# Patient Record
Sex: Female | Born: 1985
Health system: Southern US, Community
[De-identification: ages and names within clinical notes are randomized; demographics above are authoritative.]

## PROBLEM LIST (undated history)

## (undated) DIAGNOSIS — I10 Essential (primary) hypertension: Secondary | ICD-10-CM

---

## 2008-02-28 ENCOUNTER — Emergency Department (HOSPITAL_COMMUNITY): Admission: EM | Admit: 2008-02-28 | Discharge: 2008-02-28 | Payer: Self-pay | Admitting: Family Medicine

## 2008-10-11 ENCOUNTER — Emergency Department (HOSPITAL_COMMUNITY): Admission: EM | Admit: 2008-10-11 | Discharge: 2008-10-11 | Payer: Self-pay | Admitting: Emergency Medicine

## 2009-03-31 ENCOUNTER — Emergency Department (HOSPITAL_COMMUNITY): Admission: EM | Admit: 2009-03-31 | Discharge: 2009-03-31 | Payer: Self-pay | Admitting: Emergency Medicine

## 2009-08-11 ENCOUNTER — Emergency Department (HOSPITAL_COMMUNITY): Admission: EM | Admit: 2009-08-11 | Discharge: 2009-08-11 | Payer: Self-pay | Admitting: Emergency Medicine

## 2010-04-17 ENCOUNTER — Emergency Department (HOSPITAL_COMMUNITY): Admission: EM | Admit: 2010-04-17 | Discharge: 2010-04-17 | Payer: Self-pay | Admitting: Emergency Medicine

## 2010-04-19 ENCOUNTER — Emergency Department (HOSPITAL_COMMUNITY): Admission: EM | Admit: 2010-04-19 | Discharge: 2010-04-19 | Payer: Self-pay | Admitting: Emergency Medicine

## 2012-01-27 ENCOUNTER — Emergency Department (HOSPITAL_COMMUNITY)
Admission: EM | Admit: 2012-01-27 | Discharge: 2012-01-27 | Disposition: A | Discharge status: Home / Self Care | Payer: Self-pay | Attending: Emergency Medicine | Admitting: Emergency Medicine

## 2012-01-27 ENCOUNTER — Encounter (HOSPITAL_COMMUNITY): Payer: Self-pay | Admitting: Emergency Medicine

## 2012-01-27 DIAGNOSIS — N390 Urinary tract infection, site not specified: Secondary | ICD-10-CM | POA: Insufficient documentation

## 2012-01-27 DIAGNOSIS — R109 Unspecified abdominal pain: Secondary | ICD-10-CM | POA: Insufficient documentation

## 2012-01-27 DIAGNOSIS — R300 Dysuria: Secondary | ICD-10-CM | POA: Insufficient documentation

## 2012-01-27 LAB — URINALYSIS, ROUTINE W REFLEX MICROSCOPIC
Glucose, UA: NEGATIVE mg/dL
Specific Gravity, Urine: 1.01 (ref 1.005–1.030)
pH: 7 (ref 5.0–8.0)

## 2012-01-27 LAB — URINE MICROSCOPIC-ADD ON

## 2012-01-27 LAB — PREGNANCY, URINE: Preg Test, Ur: NEGATIVE

## 2012-01-27 MED ORDER — NITROFURANTOIN MONOHYD MACRO 100 MG PO CAPS: 100.0000 mg | ORAL_CAPSULE | Freq: Two times a day (BID) | ORAL | Status: DC

## 2012-01-27 NOTE — Discharge Instructions (Signed)

## 2012-01-27 NOTE — ED Notes (Signed)
Patient with c/o left flank pain since Thursday. Reports dysuria.

## 2012-01-27 NOTE — ED Provider Notes (Signed)
History   This chart was scribed for Nelia Shi, MD by Charolett Bumpers . The patient was seen in room APA05/APA05 and the patient's care was started at 10:46am.   CSN: [id number]  Arrival date & time 01/27/12  1025   First MD Initiated Contact with Patient 01/27/12 1045      Chief Complaint  Patient presents with  . Flank Pain    (Consider location/radiation/quality/duration/timing/severity/associated sxs/prior treatment) HPI Kimberly Kennedy is a 26 y.o. female who presents to the Emergency Department complaining of constant, moderate left-sided flank pain that started 2 days ago. Patient reports associated dysuria. Patient denies fever. Patient denies h/o kidney infections or h/o kidney stones. No pertinent medical hx reported. No other symptoms reported.     History reviewed. No pertinent past medical history.  History reviewed. No pertinent past surgical history.  No family history on file.  History  Substance Use Topics  . Smoking status: Never Smoker   . Smokeless tobacco: Not on file  . Alcohol Use: No    OB History    Grav Para Term Preterm Abortions TAB SAB Ect Mult Living                  Review of Systems A complete 10 system review of systems was obtained and is otherwise negative except as noted in the HPI and PMH.   Allergies  Review of patient's allergies indicates no known allergies.  Home Medications   Current Outpatient Rx  Name Route Sig Dispense Refill  . NITROFURANTOIN MONOHYD MACRO 100 MG PO CAPS Oral Take 1 capsule (100 mg total) by mouth 2 (two) times daily. 14 capsule 0    BP 152/94  Pulse 66  Temp(Src) 98.2 F (36.8 C) (Oral)  Resp 18  Ht 5\' 7"  (1.702 m)  Wt 321 lb 11.2 oz (145.922 kg)  BMI 50.39 kg/m2  SpO2 99%  LMP 01/15/2012  Physical Exam  Nursing note and vitals reviewed. Constitutional: She is oriented to person, place, and time. She appears well-developed and well-nourished. No distress.  HENT:  Head:  Normocephalic and atraumatic.  Eyes: EOM are normal. Pupils are equal, round, and reactive to light.  Neck: Normal range of motion. Neck supple. No tracheal deviation present.  Cardiovascular: Normal rate, regular rhythm and normal heart sounds.   Pulmonary/Chest: Effort normal and breath sounds normal. No respiratory distress.  Abdominal: Soft. Bowel sounds are normal. She exhibits no distension.  Musculoskeletal: Normal range of motion. She exhibits no edema.  Neurological: She is alert and oriented to person, place, and time. No sensory deficit.  Skin: Skin is warm and dry.  Psychiatric: She has a normal mood and affect. Her behavior is normal.    ED Course  Procedures (including critical care time)  DIAGNOSTIC STUDIES: Oxygen Saturation is 99% on room air, normal by my interpretation.    COORDINATION OF CARE:     Labs Reviewed  URINALYSIS, ROUTINE W REFLEX MICROSCOPIC - Abnormal; Notable for the following:    Leukocytes, UA SMALL (*)    All other components within normal limits  URINE MICROSCOPIC-ADD ON - Abnormal; Notable for the following:    Squamous Epithelial / LPF FEW (*)    Bacteria, UA MANY (*)    All other components within normal limits  PREGNANCY, URINE   No results found.   1. UTI (lower urinary tract infection)       MDM    I personally performed the services described in this  documentation, which was scribed in my presence. The recorded information has been reviewed and considered.      Nelia Shi, MD 01/27/12 [phone]

## 2012-01-27 NOTE — ED Notes (Signed)
Pt states is having urinary frequency with small amounts of urine, Pt also reports dysuria and thick green vaginal discharge. This has been going on since Thursday.  Pt also reports left flank discomfort and pressure.  Urine sample obtained and sent to lab pending results.

## 2012-01-30 ENCOUNTER — Encounter (HOSPITAL_COMMUNITY): Payer: Self-pay | Admitting: *Deleted

## 2012-01-30 ENCOUNTER — Emergency Department (HOSPITAL_COMMUNITY)
Admission: EM | Admit: 2012-01-30 | Discharge: 2012-01-31 | Disposition: A | Discharge status: Home / Self Care | Payer: Self-pay | Attending: Emergency Medicine | Admitting: Emergency Medicine

## 2012-01-30 DIAGNOSIS — I10 Essential (primary) hypertension: Secondary | ICD-10-CM | POA: Insufficient documentation

## 2012-01-30 DIAGNOSIS — R21 Rash and other nonspecific skin eruption: Secondary | ICD-10-CM | POA: Insufficient documentation

## 2012-01-30 DIAGNOSIS — T7840XA Allergy, unspecified, initial encounter: Secondary | ICD-10-CM

## 2012-01-30 DIAGNOSIS — Z888 Allergy status to other drugs, medicaments and biological substances status: Secondary | ICD-10-CM | POA: Insufficient documentation

## 2012-01-30 LAB — URINALYSIS, ROUTINE W REFLEX MICROSCOPIC
Bilirubin Urine: NEGATIVE
Ketones, ur: NEGATIVE mg/dL
Nitrite: NEGATIVE
Urobilinogen, UA: 0.2 mg/dL (ref 0.0–1.0)
pH: 6 (ref 5.0–8.0)

## 2012-01-30 NOTE — ED Notes (Signed)
Pt reports starting Bactim on Saturday.  States that she began itching yesterday with worsening today.  C/O rash and "burning sensation" on neck and upper chest.  Denies feeling SOB.  No distress noted.

## 2012-01-30 NOTE — ED Notes (Signed)
Pt reports she started taking septra sat and the next day she began to notice a rash on chest and neck, pt reports the rash is worse tonight, nad

## 2012-01-30 NOTE — ED Provider Notes (Signed)
History    This chart was scribed for Vida Roller, MD, MD by Smitty Pluck. The patient was seen in room APA12 and the patient's care was started at 11:18PM.   CSN: [id number]  Arrival date & time 01/30/12  2235   First MD Initiated Contact with Patient 01/30/12 2259      Chief Complaint  Patient presents with  . Rash    (Consider location/radiation/quality/duration/timing/severity/associated sxs/prior treatment) The history is provided by the patient.   Kimberly Kennedy is a 26 y.o. female who presents to the Emergency Department complaining of moderate rash on right and back of neck radiating to upper chest graduat onset 2 days ago and is gradually getting worse. She reports that there is a burning sensation in the same area. Pt took Benadryl with minor relief. Pt was in ED for UTI  3 days ago and was given Bactim. She started itching on Sunday after taking Bactim 1 day before. Pt reports that it was gradual onset with itching. Pt denies sores in mouth. She reports that her stomach is itching. The symptoms have been constant and improved temporarily with benadryl  History reviewed. No pertinent past medical history.  History reviewed. No pertinent past surgical history.  No family history on file.  History  Substance Use Topics  . Smoking status: Never Smoker   . Smokeless tobacco: Not on file  . Alcohol Use: No    OB History    Grav Para Term Preterm Abortions TAB SAB Ect Mult Living                  Review of Systems  All other systems reviewed and are negative.   10 Systems reviewed and are negative for acute change except as noted in the HPI.  Allergies  Review of patient's allergies indicates no known allergies.  Home Medications   Current Outpatient Rx  Name Route Sig Dispense Refill  . POLYETHYLENE GLYCOL 400 0.25 % OP SOLN Ophthalmic Apply 1 drop to eye daily as needed. For dry eye relief    . SULFAMETHOXAZOLE-TMP DS 800-160 MG PO TABS Oral Take 1  tablet by mouth 2 (two) times daily. For 7 days    . PREDNISONE 20 MG PO TABS Oral Take 1 tablet (20 mg total) by mouth daily. 10 tablet 0    BP 138/78  Pulse 82  Temp(Src) 97.7 F (36.5 C) (Oral)  Resp 20  SpO2 97%  LMP 01/15/2012  Physical Exam  Nursing note and vitals reviewed. Constitutional: She is oriented to person, place, and time. She appears well-developed and well-nourished. No distress.  HENT:  Head: Normocephalic and atraumatic.  Mouth/Throat: Oropharynx is clear and moist.       No mouth sores   Eyes: EOM are normal. Pupils are equal, round, and reactive to light.  Neck: Normal range of motion. Neck supple. No tracheal deviation present.  Cardiovascular: Normal rate.   Pulmonary/Chest: Effort normal and breath sounds normal. No respiratory distress.  Abdominal: Soft. Bowel sounds are normal. She exhibits no distension.  Musculoskeletal: Normal range of motion.  Neurological: She is alert and oriented to person, place, and time.  Skin: Skin is warm and dry.       No rashes on palms  Nail beds Fine papillar rash across chest and neck and forehead   Psychiatric: She has a normal mood and affect. Her behavior is normal.    ED Course  Procedures (including critical care time) DIAGNOSTIC STUDIES: Oxygen Saturation  is 99% on room air, normal by my interpretation.    COORDINATION OF CARE: 11:25PM EDP discusses pt ED treatment course with pt    Labs Reviewed  URINALYSIS, ROUTINE W REFLEX MICROSCOPIC   No results found.   1. Allergic reaction caused by a drug       MDM  Patient's presentation is consistent with an allergic reaction to the sulfamethoxazole trimethoprim. Patient will require prednisone, intermittent Benadryl as she has already attempted this and had some transient improvement. Will discontinue the Bactrim he immediately, retest urine to see if clean at this time. Has already had 5 days of antibiotics, may not need further treatment for  infection  Symptoms improved, prednisone given, vital signs normal, likely Bactrim drug reaction, urinalysis has been evaluated by myself and there is no signs of infection. Stop antibiotics,, prednisone  I personally performed the services described in this documentation, which was scribed in my presence. The recorded information has been reviewed and considered.           Vida Roller, MD 01/31/12 [phone]

## 2012-01-31 MED ORDER — PREDNISONE 20 MG PO TABS
40.0000 mg | ORAL_TABLET | Freq: Once | ORAL | Status: AC
  Administered 2012-01-31: 40 mg via ORAL
  Filled 2012-01-31: qty 2

## 2012-01-31 MED ORDER — PREDNISONE 20 MG PO TABS: 20.0000 mg | ORAL_TABLET | Freq: Every day | ORAL | Status: AC

## 2012-01-31 NOTE — Discharge Instructions (Signed)
The rash that you're having is likely a result of the Bactrim medication that you have been taking for your urinary infection. Your urinalysis is clean and shows no signs of infection. Please stop taking the antibiotics immediately and take the prednisone as directed. Benadryl every 6 hours as needed for itching. Return to the emergency department for severe or worsening swelling, rash, difficulty breathing

## 2012-11-20 ENCOUNTER — Emergency Department (HOSPITAL_COMMUNITY)
Admission: EM | Admit: 2012-11-20 | Discharge: 2012-11-20 | Disposition: A | Discharge status: Home / Self Care | Payer: Self-pay | Attending: Emergency Medicine | Admitting: Emergency Medicine

## 2012-11-20 ENCOUNTER — Emergency Department (HOSPITAL_COMMUNITY): Payer: Self-pay

## 2012-11-20 ENCOUNTER — Encounter (HOSPITAL_COMMUNITY): Payer: Self-pay | Admitting: Emergency Medicine

## 2012-11-20 DIAGNOSIS — R059 Cough, unspecified: Secondary | ICD-10-CM | POA: Insufficient documentation

## 2012-11-20 DIAGNOSIS — R509 Fever, unspecified: Secondary | ICD-10-CM | POA: Insufficient documentation

## 2012-11-20 DIAGNOSIS — R6889 Other general symptoms and signs: Secondary | ICD-10-CM

## 2012-11-20 DIAGNOSIS — IMO0001 Reserved for inherently not codable concepts without codable children: Secondary | ICD-10-CM | POA: Insufficient documentation

## 2012-11-20 DIAGNOSIS — R05 Cough: Secondary | ICD-10-CM | POA: Insufficient documentation

## 2012-11-20 DIAGNOSIS — Z79899 Other long term (current) drug therapy: Secondary | ICD-10-CM | POA: Insufficient documentation

## 2012-11-20 DIAGNOSIS — J029 Acute pharyngitis, unspecified: Secondary | ICD-10-CM | POA: Insufficient documentation

## 2012-11-20 MED ORDER — PROMETHAZINE-CODEINE 6.25-10 MG/5ML PO SYRP
10.0000 mL | ORAL_SOLUTION | Freq: Once | ORAL | Status: AC
  Administered 2012-11-20: 10 mL via ORAL

## 2012-11-20 MED ORDER — PROMETHAZINE-CODEINE 6.25-10 MG/5ML PO SYRP: 10.0000 mL | ORAL_SOLUTION | Freq: Four times a day (QID) | ORAL | Status: DC | PRN

## 2012-11-20 MED ORDER — PROMETHAZINE-CODEINE 6.25-10 MG/5ML PO SYRP
ORAL_SOLUTION | ORAL | Status: AC
  Administered 2012-11-20: 10 mL via ORAL
  Filled 2012-11-20: qty 10

## 2012-11-20 MED ORDER — IBUPROFEN 800 MG PO TABS
800.0000 mg | ORAL_TABLET | Freq: Once | ORAL | Status: AC
  Administered 2012-11-20: 800 mg via ORAL
  Filled 2012-11-20: qty 1

## 2012-11-20 NOTE — ED Notes (Signed)
Pt c/o sore throat, cough, bodyaches, and fever since yesterday.

## 2012-11-20 NOTE — ED Notes (Signed)
Patient with no complaints at this time. Respirations even and unlabored. Skin warm/dry. Discharge instructions reviewed with patient at this time. Patient given opportunity to voice concerns/ask questions. Patient discharged at this time and left Emergency Department with steady gait.   

## 2012-11-20 NOTE — ED Provider Notes (Signed)
History     CSN: [id number]  Arrival date & time 11/20/12  1754   First MD Initiated Contact with Patient 11/20/12 1852      Chief Complaint  Patient presents with  . Fever  . Generalized Body Aches    (Consider location/radiation/quality/duration/timing/severity/associated sxs/prior treatment) HPI Comments: Kimberly Kennedy presents with fever and chills,  Sore throat,  Body aches and nonproductive cough since waking up yesterday morning.  She has taken coricidin (chlorpheniramine -apap) with little relief of symptoms.  She has been tolerating PO fluids,  But has decreased appetite.  She describes  midsternal burning pain when she coughs, which has been nonproductive. She denies sob and denies chest pain (except when coughing).    The history is provided by the patient and a parent.    History reviewed. No pertinent past medical history.  History reviewed. No pertinent past surgical history.  No family history on file.  History  Substance Use Topics  . Smoking status: Never Smoker   . Smokeless tobacco: Not on file  . Alcohol Use: No    OB History    Grav Para Term Preterm Abortions TAB SAB Ect Mult Living                  Review of Systems  Constitutional: Positive for fever and chills.  HENT: Positive for sore throat. Negative for congestion, trouble swallowing, neck pain and neck stiffness.   Eyes: Negative.   Respiratory: Positive for cough. Negative for chest tightness, shortness of breath and wheezing.   Cardiovascular: Negative for chest pain.  Gastrointestinal: Negative for nausea, vomiting and abdominal pain.  Genitourinary: Negative.   Musculoskeletal: Negative for joint swelling and arthralgias.  Skin: Negative.  Negative for rash and wound.  Neurological: Negative for dizziness, weakness, light-headedness, numbness and headaches.  Hematological: Negative.   Psychiatric/Behavioral: Negative.     Allergies  Bactrim  Home Medications   Current  Outpatient Rx  Name  Route  Sig  Dispense  Refill  . CHLORPHENIRAMINE-ACETAMINOPHEN 2-325 MG PO TABS   Oral   Take 1-2 tablets by mouth 2 (two) times daily as needed. For cold and flu symptoms         . POLYETHYLENE GLYCOL 400 0.25 % OP SOLN   Ophthalmic   Apply 1 drop to eye daily as needed. For dry eye relief         . PROMETHAZINE-CODEINE 6.25-10 MG/5ML PO SYRP   Oral   Take 10 mLs by mouth every 6 (six) hours as needed for cough.   120 mL   0     BP 152/90  Pulse 109  Temp 102.8 F (39.3 C) (Oral)  Resp 20  Ht 5\' 7"  (1.702 m)  Wt 297 lb (134.718 kg)  BMI 46.52 kg/m2  SpO2 98%  LMP 11/20/2012  Physical Exam  Nursing note and vitals reviewed. Constitutional: She appears well-developed and well-nourished.  HENT:  Head: Normocephalic and atraumatic.  Nose: Nose normal.  Mouth/Throat: Oropharynx is clear and moist. No oropharyngeal exudate.  Eyes: Conjunctivae normal are normal.  Neck: Normal range of motion. Neck supple.  Cardiovascular: Normal rate, regular rhythm, normal heart sounds and intact distal pulses.   Pulmonary/Chest: Effort normal and breath sounds normal. No respiratory distress. She has no wheezes. She exhibits no tenderness.  Abdominal: Soft. Bowel sounds are normal. She exhibits no distension. There is no tenderness. There is no rebound.  Musculoskeletal: Normal range of motion.  Neurological: She is alert.  Skin: Skin is warm and dry. She is not diaphoretic. No erythema.  Psychiatric: She has a normal mood and affect.    ED Course  Procedures (including critical care time)   Labs Reviewed  RAPID STREP SCREEN   Dg Chest 2 View  11/20/2012  *RADIOLOGY REPORT*  Clinical Data: Fever, cough  CHEST - 2 VIEW  Comparison: None.  Findings: Lungs are essentially clear.  No focal consolidation.  No pleural effusion or pneumothorax.  The heart is top normal in size.  Visualized osseous structures are within normal limits.  IMPRESSION: No evidence of  acute cardiopulmonary disease.   Original Report Authenticated By: Charline Bills, M.D.      1. Flu-like symptoms    Motrin given in ed,  Phenergan/codeine was helpful with cough and sore throat. Temp recheck improved.  MDM  Prescribed phenergan/codeine.  Encouraged rest, fluids,  Tylenol/motrin.  Discussed tamiflu which pt deferred.   The patient appears reasonably screened and/or stabilized for discharge and I doubt any other medical condition or other [facility] requiring further screening, evaluation, or treatment in the ED at this time prior to discharge. Patients labs and/or radiological studies were reviewed during the medical decision making and disposition process.        Burgess Amor, PA 11/20/12 2017  Burgess Amor, PA 11/20/12 2017

## 2012-11-21 NOTE — ED Provider Notes (Signed)
Medical screening examination/treatment/procedure(s) were performed by non-physician practitioner and as supervising physician I was immediately available for consultation/collaboration.   Carleene Cooper III, MD 11/21/12 [phone]

## 2016-11-03 ENCOUNTER — Emergency Department (HOSPITAL_COMMUNITY)
Admission: EM | Admit: 2016-11-03 | Discharge: 2016-11-03 | Disposition: A | Discharge status: Home / Self Care | Payer: BLUE CROSS/BLUE SHIELD | Attending: Emergency Medicine | Admitting: Emergency Medicine

## 2016-11-03 ENCOUNTER — Encounter (HOSPITAL_COMMUNITY): Payer: Self-pay | Admitting: *Deleted

## 2016-11-03 DIAGNOSIS — J069 Acute upper respiratory infection, unspecified: Secondary | ICD-10-CM | POA: Diagnosis not present

## 2016-11-03 DIAGNOSIS — J029 Acute pharyngitis, unspecified: Secondary | ICD-10-CM | POA: Diagnosis not present

## 2016-11-03 DIAGNOSIS — R0981 Nasal congestion: Secondary | ICD-10-CM | POA: Diagnosis present

## 2016-11-03 NOTE — ED Provider Notes (Signed)
[facility] DEPT Provider Note   CSN: [id number] Arrival date & time: 11/03/16  1237  By signing my name below, I, Rayna Sexton, attest that this documentation has been prepared under the direction and in the presence of Daleen Bo, MD. Electronically Signed: Rayna Sexton, ED Scribe. 11/03/16. 12:56 PM.   History   Chief Complaint Chief Complaint  Patient presents with  . Nasal Congestion  . Cough    HPI HPI Comments: Kimberly Kennedy is a 30 y.o. female who presents to the Emergency Department complaining of moderate chest and sinus congestion x 3 days. Pt reports associated HA, gradually alleviating sore throat, mild SOB with exertion, and a productive cough. Pt has taken mucinex and and allegra w/o long term relief. She confirms sick contacts with a URI and denies having had her flu shot. She works as a Education officer, [employer]. She denies fevers, chills or other associated symptoms at this time.   The history is provided by the patient and medical records. No language interpreter was used.    History reviewed. No pertinent past medical history.  There are no active problems to display for this patient.   History reviewed. No pertinent surgical history.  OB History    No data available       Home Medications    Prior to Admission medications   Medication Sig Start Date End Date Taking? Authorizing Provider  Chlorpheniramine-APAP (CORICIDIN) 2-325 MG TABS Take 1-2 tablets by mouth 2 (two) times daily as needed. For cold and flu symptoms    Historical Provider, MD  Polyethylene Glycol 400 (BLINK TEARS) 0.25 % SOLN Apply 1 drop to eye daily as needed. For dry eye relief    Historical Provider, MD  promethazine-codeine (PHENERGAN WITH CODEINE) 6.25-10 MG/5ML syrup Take 10 mLs by mouth every 6 (six) hours as needed for cough. 11/20/12   Evalee Jefferson, PA-C    Family History No family history on file.  Social History Social History  Substance Use Topics  . Smoking  status: Never Smoker  . Smokeless tobacco: Never Used  . Alcohol use No     Allergies   Bactrim [sulfamethoxazole-trimethoprim]   Review of Systems Review of Systems  Constitutional: Negative for chills and fever.  HENT: Positive for congestion, postnasal drip and sore throat.   Respiratory: Positive for cough and shortness of breath.   Neurological: Positive for headaches.  All other systems reviewed and are negative.  Physical Exam Updated Vital Signs BP 158/93 (BP Location: Left Arm)   Pulse 70   Temp 98.5 F (36.9 C) (Temporal)   Resp 20   Ht 5\' 7"  (1.702 m)   Wt (!) 318 lb 14.4 oz (144.7 kg)   LMP 10/30/2016   SpO2 100%   BMI 49.95 kg/m   Physical Exam  Constitutional: She is oriented to person, place, and time. She appears well-developed and well-nourished.  HENT:  Head: Normocephalic and atraumatic.  Eyes: Conjunctivae and EOM are normal. Pupils are equal, round, and reactive to light.  Neck: Normal range of motion and phonation normal. Neck supple.  Cardiovascular: Normal rate and regular rhythm.   Pulmonary/Chest: Effort normal and breath sounds normal. She exhibits no tenderness.  Musculoskeletal: Normal range of motion.  Neurological: She is alert and oriented to person, place, and time. She exhibits normal muscle tone.  Skin: Skin is warm and dry.  Psychiatric: She has a normal mood and affect. Her behavior is normal. Judgment and thought content normal.  Nursing note and vitals  reviewed.  ED Treatments / Results  Labs (all labs ordered are listed, but only abnormal results are displayed) Labs Reviewed - No data to display  EKG  EKG Interpretation None       Radiology No results found.  Procedures Procedures  DIAGNOSTIC STUDIES: Oxygen Saturation is 100% on RA, normal by my interpretation.    COORDINATION OF CARE: 12:55 PM Discussed next steps with pt. Pt verbalized understanding and is agreeable with the plan.    Medications Ordered in  ED Medications - No data to display   Initial Impression / Assessment and Plan / ED Course  I have reviewed the triage vital signs and the nursing notes.  Pertinent labs & imaging results that were available during my care of the patient were reviewed by me and considered in my medical decision making (see chart for details).  Clinical Course    Medications - No data to display  Patient Vitals for the past 24 hrs:  BP Temp Temp src Pulse Resp SpO2 Height Weight  11/03/16 1245 - - - - - - - (!) 318 lb 14.4 oz (144.7 kg)  11/03/16 1244 158/93 98.5 F (36.9 C) Temporal 70 20 100 % - -  11/03/16 1242 - - - - - - 5\' 7"  (1.702 m) -    At D/C Reevaluation with update and discussion. After initial assessment and treatment, an updated evaluation reveals No change in clinical status, findings discussed with patient and all questions answered. [name] L     I personally performed the services described in this documentation, which was scribed in my presence. The recorded information has been reviewed and is accurate.   Final Clinical Impressions(s) / ED Diagnoses   Final diagnoses:  Upper respiratory tract infection, unspecified type    Nonspecific symptoms, URI person vs allergic syndrome. Doubt serous bacterial infection or impending vascular collapse.  Nursing Notes Reviewed/ Care Coordinated Applicable Imaging Reviewed Interpretation of Laboratory Data incorporated into ED treatment  The patient appears reasonably screened and/or stabilized for discharge and I doubt any other medical condition or other [facility] requiring further screening, evaluation, or treatment in the ED at this time prior to discharge.  Plan: Home Medications- OTC, when necessary; Home Treatments- rest, fluids; return here if the recommended treatment, does not improve the symptoms; Recommended follow up- PCP, when necessary   New Prescriptions Discharge Medication List as of 11/03/2016 12:57 PM         Daleen Bo, MD 11/03/16 1344

## 2016-11-03 NOTE — ED Triage Notes (Signed)
Pt comes in with nasal congestion starting 3 days ago. Yesterday she began to have a productive cough with brown sputum. States she feels she gets out of breath when walking short distances. NAD noted.

## 2016-11-03 NOTE — Discharge Instructions (Signed)
Your symptoms are nonspecific but likely represent a viral infection.  For congestion, use guaifenesin, and for cough use dextromethorphan.  Drink plenty of fluids.  Use acetaminophen or ibuprofen for pain and fever.

## 2017-06-15 DIAGNOSIS — G44219 Episodic tension-type headache, not intractable: Secondary | ICD-10-CM | POA: Diagnosis not present

## 2017-06-15 DIAGNOSIS — H40033 Anatomical narrow angle, bilateral: Secondary | ICD-10-CM | POA: Diagnosis not present

## 2017-06-25 ENCOUNTER — Encounter: Payer: Self-pay | Admitting: Family Medicine

## 2017-06-25 ENCOUNTER — Ambulatory Visit (INDEPENDENT_AMBULATORY_CARE_PROVIDER_SITE_OTHER): Payer: BLUE CROSS/BLUE SHIELD | Admitting: Family Medicine

## 2017-06-25 VITALS — BP 132/82 | Ht 67.0 in | Wt 335.2 lb

## 2017-06-25 DIAGNOSIS — R609 Edema, unspecified: Secondary | ICD-10-CM

## 2017-06-25 DIAGNOSIS — Z79899 Other long term (current) drug therapy: Secondary | ICD-10-CM | POA: Diagnosis not present

## 2017-06-25 DIAGNOSIS — Z13220 Encounter for screening for lipoid disorders: Secondary | ICD-10-CM | POA: Diagnosis not present

## 2017-06-25 DIAGNOSIS — D649 Anemia, unspecified: Secondary | ICD-10-CM

## 2017-06-25 DIAGNOSIS — R5383 Other fatigue: Secondary | ICD-10-CM

## 2017-06-25 MED ORDER — TRIAMTERENE-HCTZ 37.5-25 MG PO CAPS: ORAL_CAPSULE | ORAL | 0 refills | Status: DC

## 2017-06-25 NOTE — Progress Notes (Signed)
   Subjective:    Patient ID: Kimberly Kennedy, female    DOB: 1986-10-16, 31 y.o.   MRN: [id number]  HPI  Patient arrives as a new patient to discuss weight loss               and working out. Patient also tastes she has noticed she is retaining more fluid lately.  Busy and stressed these days form work  Noting more and more swelling in the ankles, worsening over time, conscerned about this  Notes freq tired during menses  Lot of tiredness  Working hard, not often with the exrvise  [place] the [place], working out less  Poor diet, overall, sometimes has poor habitds  Did have a oc visit in [place] in [place],    Got masters in health administration  Working  Review of Systems  Constitutional: Negative for activity change, appetite change and fatigue.  HENT: Negative for congestion, ear discharge and rhinorrhea.   Eyes: Negative for discharge.  Respiratory: Negative for cough, chest tightness and wheezing.   Cardiovascular: Negative for chest pain.  Gastrointestinal: Negative for abdominal pain and vomiting.  Genitourinary: Negative for difficulty urinating and frequency.  Musculoskeletal: Negative for neck pain.  Allergic/Immunologic: Negative for environmental allergies and food allergies.  Neurological: Negative for weakness and headaches.  Psychiatric/Behavioral: Negative for agitation and behavioral problems.  All other systems reviewed and are negative.      Objective:   Physical Exam  Alert and oriented, vitals reviewed and stable, NAD ENT-TM's and ext canals WNL bilat via otoscopic exam Soft palate, tonsils and post pharynx WNL via oropharyngeal exam Neck-symmetric, no masses; thyroid nonpalpable and nontender Pulmonary-no tachypnea or accessory muscle use; Clear without wheezes via auscultation Card--no abnrml murmurs, rhythm reg and rate WNL Carotid pulses symmetric, without bruits Substantial obesity present Positive venous  stasis( start     Assessment & Plan:  Impression 1 morbid obesity discussed #2 fatigue significant and profound #3 intermittent edema. Uncomfortable to patient. Venous stasis in naturedietary referral for weight loss/appropriate blood work/when necessary [place]

## 2017-06-26 DIAGNOSIS — Z13220 Encounter for screening for lipoid disorders: Secondary | ICD-10-CM | POA: Diagnosis not present

## 2017-06-26 DIAGNOSIS — R609 Edema, unspecified: Secondary | ICD-10-CM | POA: Diagnosis not present

## 2017-06-26 DIAGNOSIS — R5383 Other fatigue: Secondary | ICD-10-CM | POA: Diagnosis not present

## 2017-06-26 DIAGNOSIS — Z79899 Other long term (current) drug therapy: Secondary | ICD-10-CM | POA: Diagnosis not present

## 2017-06-27 LAB — CBC WITH DIFFERENTIAL/PLATELET
BASOS ABS: 0 10*3/uL (ref 0.0–0.2)
Basos: 0 %
EOS (ABSOLUTE): 0.1 10*3/uL (ref 0.0–0.4)
EOS: 1 %
HEMATOCRIT: 31.7 % — AB (ref 34.0–46.6)
Hemoglobin: 9.6 g/dL — ABNORMAL LOW (ref 11.1–15.9)
IMMATURE GRANULOCYTES: 0 %
Immature Grans (Abs): 0 10*3/uL (ref 0.0–0.1)
LYMPHS ABS: 2.4 10*3/uL (ref 0.7–3.1)
Lymphs: 34 %
MCH: 21.3 pg — ABNORMAL LOW (ref 26.6–33.0)
MCHC: 30.3 g/dL — AB (ref 31.5–35.7)
MCV: 70 fL — ABNORMAL LOW (ref 79–97)
MONOS ABS: 0.5 10*3/uL (ref 0.1–0.9)
Monocytes: 7 %
NEUTROS PCT: 58 %
Neutrophils Absolute: 4.1 10*3/uL (ref 1.4–7.0)
PLATELETS: 352 10*3/uL (ref 150–379)
RBC: 4.51 x10E6/uL (ref 3.77–5.28)
RDW: 17.9 % — ABNORMAL HIGH (ref 12.3–15.4)
WBC: 7.1 10*3/uL (ref 3.4–10.8)

## 2017-06-27 LAB — LIPID PANEL
Chol/HDL Ratio: 2.7 ratio (ref 0.0–4.4)
Cholesterol, Total: 129 mg/dL (ref 100–199)
HDL: 48 mg/dL (ref >39)
LDL CALC: 63 mg/dL (ref 0–99)
TRIGLYCERIDES: 88 mg/dL (ref 0–149)
VLDL Cholesterol Cal: 18 mg/dL (ref 5–40)

## 2017-06-27 LAB — BASIC METABOLIC PANEL
BUN / CREAT RATIO: 11 (ref 9–23)
BUN: 10 mg/dL (ref 6–20)
CHLORIDE: 102 mmol/L (ref 96–106)
CO2: 23 mmol/L (ref 20–29)
Calcium: 9.4 mg/dL (ref 8.7–10.2)
Creatinine, Ser: 0.87 mg/dL (ref 0.57–1.00)
GFR calc Af Amer: 101 mL/min/{1.73_m2} (ref >59)
GFR calc non Af Amer: 88 mL/min/{1.73_m2} (ref >59)
GLUCOSE: 104 mg/dL — AB (ref 65–99)
Potassium: 5 mmol/L (ref 3.5–5.2)
SODIUM: 138 mmol/L (ref 134–144)

## 2017-06-27 LAB — HEPATIC FUNCTION PANEL
ALBUMIN: 4.1 g/dL (ref 3.5–5.5)
ALK PHOS: 52 IU/L (ref 39–117)
ALT: 11 IU/L (ref 0–32)
AST: 13 IU/L (ref 0–40)
BILIRUBIN, DIRECT: 0.08 mg/dL (ref 0.00–0.40)
Bilirubin Total: 0.2 mg/dL (ref 0.0–1.2)
Total Protein: 7.2 g/dL (ref 6.0–8.5)

## 2017-06-27 LAB — TSH: TSH: 1.64 u[IU]/mL (ref 0.450–4.500)

## 2017-06-29 ENCOUNTER — Encounter: Payer: Self-pay | Admitting: Family Medicine

## 2017-07-04 NOTE — Addendum Note (Signed)
Addended by: Dairl Ponder on: 07/04/2017 09:51 AM   Modules accepted: Orders

## 2017-07-30 DIAGNOSIS — D649 Anemia, unspecified: Secondary | ICD-10-CM | POA: Diagnosis not present

## 2017-07-31 LAB — IRON AND TIBC
IRON SATURATION: 6 % — AB (ref 15–55)
IRON: 23 ug/dL — AB (ref 27–159)
Total Iron Binding Capacity: 396 ug/dL (ref 250–450)
UIBC: 373 ug/dL (ref 131–425)

## 2017-07-31 LAB — FERRITIN: FERRITIN: 13 ng/mL — AB (ref 15–150)

## 2017-08-27 ENCOUNTER — Encounter: Payer: Self-pay | Admitting: Nurse Practitioner

## 2017-08-27 ENCOUNTER — Encounter: Payer: Self-pay | Admitting: Family Medicine

## 2017-08-27 ENCOUNTER — Ambulatory Visit (INDEPENDENT_AMBULATORY_CARE_PROVIDER_SITE_OTHER): Payer: BLUE CROSS/BLUE SHIELD | Admitting: Nurse Practitioner

## 2017-08-27 VITALS — BP 144/104 | Temp 98.0°F | Ht 67.0 in | Wt 331.0 lb

## 2017-08-27 DIAGNOSIS — J0120 Acute ethmoidal sinusitis, unspecified: Secondary | ICD-10-CM | POA: Diagnosis not present

## 2017-08-27 DIAGNOSIS — J029 Acute pharyngitis, unspecified: Secondary | ICD-10-CM

## 2017-08-27 LAB — POCT RAPID STREP A (OFFICE): Rapid Strep A Screen: NEGATIVE

## 2017-08-27 MED ORDER — AZITHROMYCIN 250 MG PO TABS: ORAL_TABLET | ORAL | 0 refills | Status: DC

## 2017-08-27 NOTE — Progress Notes (Signed)
Subjective:  Presents for complaints of chest congestion and cough that began 4 days ago. Has gotten progressively worse. Low-grade fever. Sore throat. Ethmoid sinus area headache. Runny nose and head congestion. Cough worse at night. Producing clear mucus. No wheezing but slight chest tightness at times. Has not taken any decongestants.  Objective:   BP (!) 144/104   Temp 98 F (36.7 C) (Oral)   Ht 5\' 7"  (1.702 m)   Wt (!) 331 lb (150.1 kg)   BMI 51.84 kg/m  NAD. Alert, oriented. TMs clear effusion, no erythema. Left TM partially secured with dark cerumen. Pharynx nonerythematous with PND noted. Neck supple with mild soft anterior adenopathy. Lungs clear. Heart regular rate rhythm.  Assessment:  Acute non-recurrent ethmoidal sinusitis  Sore throat - Plan: POCT rapid strep A, Strep A DNA probe    Plan:   Meds ordered this encounter  Medications  . azithromycin (ZITHROMAX Z-PAK) 250 MG tablet    Sig: Take 2 tablets (500 mg) on  Day 1,  followed by 1 tablet (250 mg) once daily on Days 2 through 5.    Dispense:  6 each    Refill:  0    Order Specific Question:   Supervising Provider    Answer:   Mikey Kirschner [[id number]]   OTC meds as directed for congestion and cough. Callback of the end of the week if no improvement, sooner if worse.  Had elevated BP at our office visit today. Recommend patient check BP outside the office. Would like to see consistent numbers below 140/90. Recommend that she take her fluid pill already prescribed daily. Verbalizes understanding.

## 2017-08-28 LAB — STREP A DNA PROBE: Strep Gp A Direct, DNA Probe: NEGATIVE

## 2017-09-25 ENCOUNTER — Ambulatory Visit (INDEPENDENT_AMBULATORY_CARE_PROVIDER_SITE_OTHER): Payer: BLUE CROSS/BLUE SHIELD | Admitting: Family Medicine

## 2017-09-25 ENCOUNTER — Encounter: Payer: Self-pay | Admitting: Family Medicine

## 2017-09-25 VITALS — BP 118/88 | Ht 67.0 in | Wt 335.0 lb

## 2017-09-25 DIAGNOSIS — R609 Edema, unspecified: Secondary | ICD-10-CM

## 2017-09-25 DIAGNOSIS — Z23 Encounter for immunization: Secondary | ICD-10-CM | POA: Diagnosis not present

## 2017-09-25 MED ORDER — TRIAMTERENE-HCTZ 37.5-25 MG PO CAPS: ORAL_CAPSULE | ORAL | 6 refills | Status: DC

## 2017-09-25 NOTE — Progress Notes (Signed)
   Subjective:    Patient ID: Kimberly Kennedy, female    DOB: 1986-02-27, 31 y.o.   MRN: [id number]  HPI Patient is here today to follow up visit in July when she was retaining a lot of fluid.Patient is currently on Dyazide 37.5-25 mg one po up to three times per week for edema. She thinks this has helped some with the edema.   Diet not so hot, trying but often eating the wrong stuff  Busy with work , but often eats on the riun   Using diuretics reg three times per wk    Walking with clients some only on occasion      Helping with the swelling otf the ankles,     Review of Systems No headache, no major weight loss or weight gain, no chest pain no back pain abdominal pain no change in bowel habits complete ROS otherwise negative     Objective:   Physical Exam Alert vitals stable, NAD. Blood pressure good on repeat. HEENT normal. Lungs clear. Heart regular rate and rhythm. Morbid obesity present       Assessment & Plan:  Impression 1 follow-up for venous stasis persists medication definitely helps. #61morbid obesity. Discussed at length. Concerning for patient I've advised her if she does not improve on her own will need bariatric intervention.  Flu shot

## 2018-02-01 ENCOUNTER — Ambulatory Visit: Payer: BLUE CROSS/BLUE SHIELD | Admitting: Family Medicine

## 2018-02-01 ENCOUNTER — Encounter: Payer: Self-pay | Admitting: Family Medicine

## 2018-02-01 VITALS — BP 128/90 | Temp 98.6°F | Ht 67.0 in | Wt 336.0 lb

## 2018-02-01 DIAGNOSIS — L02411 Cutaneous abscess of right axilla: Secondary | ICD-10-CM | POA: Diagnosis not present

## 2018-02-01 MED ORDER — DOXYCYCLINE HYCLATE 100 MG PO TABS: 100.0000 mg | ORAL_TABLET | Freq: Two times a day (BID) | ORAL | 0 refills | Status: DC

## 2018-02-01 NOTE — Progress Notes (Signed)
   Subjective:    Patient ID: Kimberly Kennedy, female    DOB: Jun 29, 1986, 31 y.o.   MRN: [id number]  Abscess  This is a new problem. Episode onset: 2 days. Treatments tried: hot compress, tree oil, witch hazel, advil for pain.   Patient has experienced 1 of these in the past.  Painful in nature.  Swelling.  He occurred in her armpit.  In the last day has discharge considerably.  No fever or chills no headache no     Review of Systems No rash elsewhere no    Objective:   Physical Exam  Alert vitals stable, NAD. Blood pressure good on repeat. HEENT normal. Lungs clear. Heart regular rate and rhythm. Right axillary region ruptured abscess noted with induration tenderness and surrounding cellulitis impression      Assessment & Plan:  Draining abscess with element of cellulitis culture obtained local measures discussed antibiotics prescribed warning signs discussed

## 2018-02-04 LAB — WOUND CULTURE

## 2018-02-04 MED ORDER — AMOXICILLIN-POT CLAVULANATE 875-125 MG PO TABS: 1.0000 | ORAL_TABLET | Freq: Two times a day (BID) | ORAL | 0 refills | Status: AC

## 2018-02-04 NOTE — Addendum Note (Signed)
Addended by: Dairl Ponder on: 02/04/2018 03:30 PM   Modules accepted: Orders

## 2018-02-08 ENCOUNTER — Telehealth: Payer: Self-pay | Admitting: Family Medicine

## 2018-02-08 MED ORDER — FLUCONAZOLE 150 MG PO TABS: ORAL_TABLET | ORAL | 0 refills | Status: DC

## 2018-02-08 NOTE — Telephone Encounter (Signed)
Patient is aware of all and antifungal sent to [facility].

## 2018-02-08 NOTE — Telephone Encounter (Signed)
Pt's antibiotic was changed due to resistance   Since change pt's having nausea, diarrhea, & today she feels warm & temp is 98.3  Pt wonders if this is a possible side effects of meds & suggestions  Please advise & call pt    [place]

## 2018-02-08 NOTE — Telephone Encounter (Signed)
I called and mother answered,states pt is in shower will have her to return call once out.

## 2018-02-08 NOTE — Telephone Encounter (Signed)
Add diflucan 150 one p o three d apart, stick with amox, add probiotics

## 2018-02-08 NOTE — Telephone Encounter (Signed)
I called and spoke with the pt she states she was on doxycycline and then she was called and told she needed to change to amoxicillin on Tuesday,since then she states she has had some nausea and diarrhea,no fevers,but does have a vaginal itch and some discharge. Wants to know if the antibx could be causing the diarrhea,I told her yes that some time they can. She also wants to know if we can send in [place]. Please advise.

## 2018-03-13 ENCOUNTER — Other Ambulatory Visit: Payer: Self-pay

## 2018-03-13 ENCOUNTER — Encounter (HOSPITAL_COMMUNITY): Payer: Self-pay | Admitting: Emergency Medicine

## 2018-03-13 ENCOUNTER — Emergency Department (HOSPITAL_COMMUNITY)
Admission: EM | Admit: 2018-03-13 | Discharge: 2018-03-13 | Disposition: A | Discharge status: Home / Self Care | Payer: BLUE CROSS/BLUE SHIELD | Attending: Emergency Medicine | Admitting: Emergency Medicine

## 2018-03-13 DIAGNOSIS — R609 Edema, unspecified: Secondary | ICD-10-CM | POA: Diagnosis not present

## 2018-03-13 DIAGNOSIS — T63481A Toxic effect of venom of other arthropod, accidental (unintentional), initial encounter: Secondary | ICD-10-CM | POA: Insufficient documentation

## 2018-03-13 DIAGNOSIS — Z79899 Other long term (current) drug therapy: Secondary | ICD-10-CM | POA: Diagnosis not present

## 2018-03-13 DIAGNOSIS — T63441A Toxic effect of venom of bees, accidental (unintentional), initial encounter: Secondary | ICD-10-CM | POA: Diagnosis not present

## 2018-03-13 DIAGNOSIS — L989 Disorder of the skin and subcutaneous tissue, unspecified: Secondary | ICD-10-CM | POA: Diagnosis present

## 2018-03-13 NOTE — ED Provider Notes (Signed)
[facility] EMERGENCY DEPARTMENT Provider Note   CSN: [id number] Arrival date & time: 03/13/18  0350  Time seen 05:05 AM   History   Chief Complaint Chief Complaint  Patient presents with  . Insect Bite    HPI Kimberly Kennedy is a 32 y.o. female.  HPI patient states April 8 she had a insect bite on her left upper arm that was itching.  She states yesterday morning she noticed another lesion on her medial left ankle.  She states she put rubbing alcohol on it to help with the itching.  Last night she started to have swelling around the lesion on her ankle but states she normally gets swelling at her ankles at night after working all day.  She states it is getting more painful tonight.  She states the lesion on her arm has been draining some clear fluid.  She denies fever.  She denies doing any yard work but states the grass is tall in her yard when she walks through it.  PCP Mikey Kirschner, MD   History reviewed. No pertinent past medical history.  There are no active problems to display for this patient.   History reviewed. No pertinent surgical history.   OB History   None      Home Medications    Prior to Admission medications   Medication Sig Start Date End Date Taking? Authorizing Provider  doxycycline (VIBRA-TABS) 100 MG tablet Take 1 tablet (100 mg total) by mouth 2 (two) times daily. 02/01/18   Mikey Kirschner, MD  fluconazole (DIFLUCAN) 150 MG tablet Take one po three days apart 02/08/18   Mikey Kirschner, MD  Polyethylene Glycol 400 (BLINK TEARS) 0.25 % SOLN Apply 1 drop to eye daily as needed. For dry eye relief    [provider]  triamterene-hydrochlorothiazide (DYAZIDE) 37.5-25 MG capsule One po up to three times a week for edema as needed 09/25/17   Mikey Kirschner, MD    Family History History reviewed. No pertinent family history.  Social History Social History   Tobacco Use  . Smoking status: Never Smoker  . Smokeless tobacco:  Never Used  Substance Use Topics  . Alcohol use: No  . Drug use: No  employed   Allergies   Bactrim [sulfamethoxazole-trimethoprim]   Review of Systems Review of Systems  All other systems reviewed and are negative.    Physical Exam Updated Vital Signs BP (!) 155/90 (BP Location: Left Arm)   Pulse 79   Temp 98.9 F (37.2 C) (Oral)   Resp 20   Ht 5\' 7"  (1.702 m)   Wt (!) 147 kg (324 lb)   LMP 02/07/2018   SpO2 100%   BMI 50.75 kg/m   Vital signs normal    Physical Exam  Constitutional: She is oriented to person, place, and time. She appears well-developed and well-nourished. No distress.  HENT:  Head: Normocephalic and atraumatic.  Right Ear: External ear normal.  Left Ear: External ear normal.  Nose: Nose normal.  Eyes: Conjunctivae and EOM are normal.  Neck: Normal range of motion.  Cardiovascular: Normal rate.  Pulmonary/Chest: Effort normal. No respiratory distress.  Musculoskeletal: Normal range of motion. She exhibits edema and tenderness.  Neurological: She is alert and oriented to person, place, and time. No cranial nerve deficit.  Skin: Skin is warm and dry. There is erythema.  Psychiatric: She has a normal mood and affect. Her behavior is normal. Thought content normal.  Nursing note and vitals  reviewed.   Left upper arm   Right ankle     ED Treatments / Results  Labs (all labs ordered are listed, but only abnormal results are displayed) Labs Reviewed - No data to display  EKG None  Radiology No results found.  Procedures Procedures (including critical care time)  Medications Ordered in ED Medications - No data to display   Initial Impression / Assessment and Plan / ED Course  I have reviewed the triage vital signs and the nursing notes.  Pertinent labs & imaging results that were available during my care of the patient were reviewed by me and considered in my medical decision making (see chart for details).     Patient has  the appearance of a localized reaction to insect bite.  I do not suspect that she has a secondary infection.  She was advised on over-the-counter treatments she can use for her insect bites.  She should return if they get infected.  Final Clinical Impressions(s) / ED Diagnoses   Final diagnoses:  Local reaction to insect sting, accidental or unintentional, initial encounter    ED Discharge Orders    None    OTC ibuprofen and acetaminophen, cortaid, benadryl cream  Plan discharge  Rolland Porter, MD, Barbette Or, MD 03/13/18 [phone]

## 2018-03-13 NOTE — ED Triage Notes (Signed)
Pt states she has a red scabbed over area on her left lower leg and on her left upper arm that she thinks a mosquito bit her. Pt states the area itches, and is red and swollen and warm to touch. Denies fever, n/v/d.

## 2018-03-13 NOTE — Discharge Instructions (Addendum)
Use ice packs for severe itching. At bedtime you can take 25-50 mg OTC if needed to help you sleep. Get bendaryl cream and cort-aid cream OTC and place on areas 4 times a day for the itching and redness. You can take acetaminophen 1000 mg + motrin 600 mg 4 times a day for pain as needed. Recheck if you get a fever, see a red streak running up your leg or the redness continues to get bigger. STOP USING THE ALCOHOL ON THE AREAS.

## 2018-03-26 ENCOUNTER — Encounter: Payer: Self-pay | Admitting: Family Medicine

## 2018-03-26 ENCOUNTER — Ambulatory Visit: Payer: BLUE CROSS/BLUE SHIELD | Admitting: Family Medicine

## 2018-03-26 VITALS — Temp 97.8°F | Ht 67.0 in | Wt 327.2 lb

## 2018-03-26 DIAGNOSIS — A084 Viral intestinal infection, unspecified: Secondary | ICD-10-CM

## 2018-03-26 MED ORDER — ONDANSETRON 4 MG PO TBDP: 4.0000 mg | ORAL_TABLET | Freq: Three times a day (TID) | ORAL | 1 refills | Status: DC | PRN

## 2018-03-26 NOTE — Progress Notes (Signed)
   Subjective:    Patient ID: Kimberly Kennedy, female    DOB: 19-Oct-1986, 32 y.o.   MRN: [id number]  HPI Patient arrives with diarrhea and abdominal cramps since last Wednesday  Felt nauseated, dim energy  Low gr temp possibly  Diarrhea  Off and on since then  Uses immodium  Prn  Mild epigastri burning some cramping gas like symtoms prn    Diarrhea off and on  Stomach felt wuivering and achey  Still working      Review of Systems   No headache no chest pain or r alert active good hydration.  HEENT normal.  Ash  Objective:   Physical Exam  Lungs clear.  Heart rate and rhythm.  Abdomen benign.  Diffuse active bowel sounds.  No discrete tenderness  Impression viral gastroenteritis plan diet discussed.  Warning signs discussed.  Zofran as needed for nausea continue Imodium as needed      Assessment & Plan:

## 2018-08-28 ENCOUNTER — Emergency Department (HOSPITAL_COMMUNITY)
Admission: EM | Admit: 2018-08-28 | Discharge: 2018-08-28 | Disposition: A | Discharge status: Home / Self Care | Payer: BLUE CROSS/BLUE SHIELD | Attending: Emergency Medicine | Admitting: Emergency Medicine

## 2018-08-28 ENCOUNTER — Emergency Department (HOSPITAL_COMMUNITY): Payer: BLUE CROSS/BLUE SHIELD

## 2018-08-28 ENCOUNTER — Other Ambulatory Visit: Payer: Self-pay

## 2018-08-28 ENCOUNTER — Encounter (HOSPITAL_COMMUNITY): Payer: Self-pay

## 2018-08-28 ENCOUNTER — Telehealth: Payer: Self-pay | Admitting: Family Medicine

## 2018-08-28 DIAGNOSIS — D259 Leiomyoma of uterus, unspecified: Secondary | ICD-10-CM | POA: Insufficient documentation

## 2018-08-28 DIAGNOSIS — D219 Benign neoplasm of connective and other soft tissue, unspecified: Secondary | ICD-10-CM | POA: Diagnosis not present

## 2018-08-28 DIAGNOSIS — R1033 Periumbilical pain: Secondary | ICD-10-CM

## 2018-08-28 DIAGNOSIS — R197 Diarrhea, unspecified: Secondary | ICD-10-CM | POA: Diagnosis not present

## 2018-08-28 LAB — URINALYSIS, ROUTINE W REFLEX MICROSCOPIC
Bilirubin Urine: NEGATIVE
GLUCOSE, UA: NEGATIVE mg/dL
Hgb urine dipstick: NEGATIVE
Ketones, ur: NEGATIVE mg/dL
Leukocytes, UA: NEGATIVE
Nitrite: NEGATIVE
PH: 5 (ref 5.0–8.0)
PROTEIN: NEGATIVE mg/dL
Specific Gravity, Urine: 1.021 (ref 1.005–1.030)

## 2018-08-28 LAB — COMPREHENSIVE METABOLIC PANEL
ALK PHOS: 49 U/L (ref 38–126)
ALT: 16 U/L (ref 0–44)
AST: 14 U/L — AB (ref 15–41)
Albumin: 4 g/dL (ref 3.5–5.0)
Anion gap: 7 (ref 5–15)
BUN: 10 mg/dL (ref 6–20)
CALCIUM: 9 mg/dL (ref 8.9–10.3)
CHLORIDE: 105 mmol/L (ref 98–111)
CO2: 25 mmol/L (ref 22–32)
CREATININE: 0.82 mg/dL (ref 0.44–1.00)
Glucose, Bld: 96 mg/dL (ref 70–99)
Potassium: 4 mmol/L (ref 3.5–5.1)
SODIUM: 137 mmol/L (ref 135–145)
Total Bilirubin: 0.4 mg/dL (ref 0.3–1.2)
Total Protein: 7.9 g/dL (ref 6.5–8.1)

## 2018-08-28 LAB — CBC
HCT: 32 % — ABNORMAL LOW (ref 36.0–46.0)
Hemoglobin: 9.8 g/dL — ABNORMAL LOW (ref 12.0–15.0)
MCH: 21.4 pg — AB (ref 26.0–34.0)
MCHC: 30.6 g/dL (ref 30.0–36.0)
MCV: 69.9 fL — AB (ref 78.0–100.0)
PLATELETS: 305 10*3/uL (ref 150–400)
RBC: 4.58 MIL/uL (ref 3.87–5.11)
RDW: 17.9 % — AB (ref 11.5–15.5)
WBC: 8.3 10*3/uL (ref 4.0–10.5)

## 2018-08-28 LAB — LIPASE, BLOOD: Lipase: 36 U/L (ref 11–51)

## 2018-08-28 LAB — PREGNANCY, URINE: Preg Test, Ur: NEGATIVE

## 2018-08-28 MED ORDER — SODIUM CHLORIDE 0.9 % IV SOLN
INTRAVENOUS | Status: DC
  Administered 2018-08-28: 16:00:00 via INTRAVENOUS

## 2018-08-28 MED ORDER — IOPAMIDOL (ISOVUE-300) INJECTION 61%
100.0000 mL | Freq: Once | INTRAVENOUS | Status: AC | PRN
  Administered 2018-08-28: 100 mL via INTRAVENOUS

## 2018-08-28 MED ORDER — TRAMADOL HCL 50 MG PO TABS: 50.0000 mg | ORAL_TABLET | Freq: Four times a day (QID) | ORAL | 0 refills | Status: DC | PRN

## 2018-08-28 MED ORDER — ONDANSETRON 4 MG PO TBDP: 4.0000 mg | ORAL_TABLET | Freq: Three times a day (TID) | ORAL | 1 refills | Status: DC | PRN

## 2018-08-28 NOTE — ED Provider Notes (Signed)
[facility] EMERGENCY DEPARTMENT Provider Note   CSN: [id number] Arrival date & time: 08/28/18  1122     History   Chief Complaint Chief Complaint  Patient presents with  . Abdominal Pain    HPI Kimberly Kennedy is a 32 y.o. female.  Patient with abdominal pain periumbilical area onset was yesterday.  Did have some diarrhea yesterday no blood.  Some nausea but no vomiting.  Pain is periumbilical area and has continued.  No prior history of similar pain.  Patient denied any abnormal vaginal bleeding or discharge denies any urinary symptoms.  The diarrhea was minimal was once yesterday and once today.     History reviewed. No pertinent past medical history.  There are no active problems to display for this patient.   History reviewed. No pertinent surgical history.   OB History   None      Home Medications    Prior to Admission medications   Medication Sig Start Date End Date Taking? Authorizing Provider  Polyethylene Glycol 400 (BLINK TEARS) 0.25 % SOLN Apply 1 drop to eye daily as needed. For dry eye relief   Yes [provider]  ondansetron (ZOFRAN ODT) 4 MG disintegrating tablet Take 1 tablet (4 mg total) by mouth every 8 (eight) hours as needed. 08/28/18   Fredia Sorrow, MD  traMADol (ULTRAM) 50 MG tablet Take 1 tablet (50 mg total) by mouth every 6 (six) hours as needed. 08/28/18   Fredia Sorrow, MD  triamterene-hydrochlorothiazide (DYAZIDE) 37.5-25 MG capsule One po up to three times a week for edema as needed Patient taking differently: Take 1 capsule by mouth 3 (three) times a week. One po up to three times a week for edema as needed 09/25/17   Mikey Kirschner, MD    Family History No family history on file.  Social History Social History   Tobacco Use  . Smoking status: Never Smoker  . Smokeless tobacco: Never Used  Substance Use Topics  . Alcohol use: No  . Drug use: No     Allergies   Bactrim  [sulfamethoxazole-trimethoprim]   Review of Systems Review of Systems  Constitutional: Negative for fever.  HENT: Negative for congestion.   Eyes: Negative for redness.  Respiratory: Negative for shortness of breath.   Cardiovascular: Negative for chest pain.  Gastrointestinal: Positive for abdominal pain, diarrhea and nausea. Negative for blood in stool and vomiting.  Genitourinary: Negative for dysuria, vaginal bleeding and vaginal discharge.  Musculoskeletal: Negative for back pain.  Skin: Negative for rash.  Neurological: Negative for syncope and headaches.  Psychiatric/Behavioral: Negative for confusion.     Physical Exam Updated Vital Signs BP 140/73   Pulse 63   Temp 99.7 F (37.6 C) (Oral) Comment: VS done by C. Woods, NT  Resp 20   LMP 07/28/2018   SpO2 100%   Physical Exam  Constitutional: She is oriented to person, place, and time. She appears well-developed and well-nourished. No distress.  HENT:  Head: Normocephalic and atraumatic.  Mouth/Throat: Oropharynx is clear and moist.  Eyes: Pupils are equal, round, and reactive to light. Conjunctivae and EOM are normal.  Neck: Normal range of motion. Neck supple.  Cardiovascular: Normal rate, regular rhythm and normal heart sounds.  Pulmonary/Chest: Effort normal and breath sounds normal. No respiratory distress.  Abdominal: Soft. Bowel sounds are normal. There is no tenderness.  Musculoskeletal: Normal range of motion. She exhibits no edema.  Neurological: She is alert and oriented to person, place, and time. No  cranial nerve deficit or sensory deficit. She exhibits normal muscle tone. Coordination normal.  Skin: Skin is warm. No rash noted.  Nursing note and vitals reviewed.    ED Treatments / Results  Labs (all labs ordered are listed, but only abnormal results are displayed) Labs Reviewed  COMPREHENSIVE METABOLIC PANEL - Abnormal; Notable for the following components:      Result Value   AST 14 (*)     All other components within normal limits  CBC - Abnormal; Notable for the following components:   Hemoglobin 9.8 (*)    HCT 32.0 (*)    MCV 69.9 (*)    MCH 21.4 (*)    RDW 17.9 (*)    All other components within normal limits  LIPASE, BLOOD  URINALYSIS, ROUTINE W REFLEX MICROSCOPIC  PREGNANCY, URINE    EKG None  Radiology Ct Abdomen Pelvis W Contrast  Result Date: 08/28/2018 CLINICAL DATA:  Paraumbilical abdominal pain and diarrhea beginning yesterday. Clinical suspicion for appendicitis. EXAM: CT ABDOMEN AND PELVIS WITH CONTRAST TECHNIQUE: Multidetector CT imaging of the abdomen and pelvis was performed using the standard protocol following bolus administration of intravenous contrast. CONTRAST:  134mL ISOVUE-300 IOPAMIDOL (ISOVUE-300) INJECTION 61% COMPARISON:  None. FINDINGS: Lower Chest: No acute findings. Hepatobiliary: No hepatic masses identified. Gallbladder is unremarkable. Pancreas:  No mass or inflammatory changes. Spleen: Within normal limits in size and appearance. Adrenals/Urinary Tract: No masses identified. No evidence of hydronephrosis. Stomach/Bowel: No evidence of obstruction, inflammatory process or abnormal fluid collections. Normal appendix visualized. Vascular/Lymphatic: No pathologically enlarged lymph nodes. No abdominal aortic aneurysm. Reproductive: Bilateral solid adnexal masses are seen which measure 4.9 cm on the right and 3.5 cm on the left. These abut the uterus and favor pedunculated fibroids, with ovarian mass is considered less likely. No evidence of inflammatory process or abnormal fluid collections. Other:  None. Musculoskeletal:  No suspicious bone lesions identified. IMPRESSION: No evidence of appendicitis. Bilateral adnexal masses, suspicious for pedunculated fibroids although ovarian masses cannot definitely be excluded. Nonemergent pelvic MRI without and with contrast is recommended for further characterization. Electronically Signed   By: Earle Gell  M.D.   On: 08/28/2018 16:56    Procedures Procedures (including critical care time)  Medications Ordered in ED Medications  0.9 %  sodium chloride infusion ( Intravenous New Bag/Given 08/28/18 1549)  iopamidol (ISOVUE-300) 61 % injection 100 mL (100 mLs Intravenous Contrast Given 08/28/18 1632)     Initial Impression / Assessment and Plan / ED Course  I have reviewed the triage vital signs and the nursing notes.  Pertinent labs & imaging results that were available during my care of the patient were reviewed by me and considered in my medical decision making (see chart for details).    Patient's work-up here in the emergency department urinalysis negative pregnancy test negative labs without significant abnormalities.  CT scan of the abdomen without any acute findings.  There is evidence of bilateral uterine fibroids very close to the ovaries but they do not think that it is ovarian masses.  Do not think this is anything to do with her symptoms.  Patient is followed by Dr. Lurena Joiner and family practice.  Patient does not have an OB/GYN.  We will give a referral.  Patient will be treated in the meantime with Zofran and a short course of tramadol.  Patient nontoxic no acute distress.  Patient's hemoglobin was 9.8 this will need to be followed.   Final Clinical Impressions(s) / ED Diagnoses  Final diagnoses:  Periumbilical abdominal pain  Fibroids    ED Discharge Orders         Ordered    ondansetron (ZOFRAN ODT) 4 MG disintegrating tablet  Every 8 hours PRN     08/28/18 1752    traMADol (ULTRAM) 50 MG tablet  Every 6 hours PRN     08/28/18 1752           Fredia Sorrow, MD 08/28/18 1759

## 2018-08-28 NOTE — ED Triage Notes (Addendum)
Pt c/o pain around umbilicus yesterday and says today it's more upper abd pain.  Reports diarrhea once today and once yesterday.  No vomiting.  Denies any abnormal vaginal bleeding/discharge, denies urinary symptoms.

## 2018-08-28 NOTE — Discharge Instructions (Signed)
Take the Zofran as needed for the nausea.  Take the tramadol as needed for pain.  Make an appointment to follow-up with your primary care doctor.  Also given referral information to [facility].

## 2018-08-28 NOTE — ED Notes (Signed)
Pt returned from CT °

## 2018-08-28 NOTE — Telephone Encounter (Signed)
Pt contacted office wanting to get appointment today. Pt states that she is having abdominal pain and it "feels like her intestines are knotting up". This started yesterday and she did have some diarrhea and nausea. The pain did stop yesterday afternoon. Pt did eat yesterday and was ok but the pain came back this morning. Pt states if feels like someone is tugging at navel. Pt advised to go to ED for further evaluation. Pt verbalized understanding.

## 2018-08-28 NOTE — Telephone Encounter (Signed)
ok 

## 2018-09-23 ENCOUNTER — Encounter: Payer: Self-pay | Admitting: Family Medicine

## 2018-09-23 ENCOUNTER — Ambulatory Visit: Payer: BLUE CROSS/BLUE SHIELD | Admitting: Family Medicine

## 2018-09-23 VITALS — BP 130/84 | Temp 98.4°F | Ht 67.0 in | Wt 338.0 lb

## 2018-09-23 DIAGNOSIS — S76012A Strain of muscle, fascia and tendon of left hip, initial encounter: Secondary | ICD-10-CM

## 2018-09-23 DIAGNOSIS — M79605 Pain in left leg: Secondary | ICD-10-CM

## 2018-09-23 MED ORDER — ETODOLAC 400 MG PO TABS: 400.0000 mg | ORAL_TABLET | Freq: Two times a day (BID) | ORAL | 0 refills | Status: DC

## 2018-09-23 NOTE — Progress Notes (Signed)
   Subjective:    Patient ID: Kimberly Kennedy, female    DOB: [birth year], 32 y.o.   MRN: [id number]  Leg Pain   The incident occurred 5 to 7 days ago. Incident location: noticed the pain while getting in the car. The pain is present in the left leg, left hip and left thigh. Associated symptoms include muscle weakness. She has tried acetaminophen, NSAIDs, ice and heat for the symptoms. The treatment provided mild relief.   Reports pain to left groin area x 5 days, noticed pain when she was getting into her car. 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 Denies any paresthesias. Reports left leg feels weaker. Reports pain is a constant throbbing, worse with certain movements when picking leg up or bending down from her waist. Reports some improvement with tylenol, ibuprofen, and ice/heat application.  Feels like pain has stayed about the same.   Review of Systems  Constitutional: Negative for fever and unexpected weight change.  Musculoskeletal: Positive for gait problem and myalgias.       Objective:   Physical Exam  Constitutional: She is oriented to person, place, and time. She appears well-developed and well-nourished. No distress.  HENT:  Head: Normocephalic and atraumatic.  Pulmonary/Chest: Effort normal. No respiratory distress.  Musculoskeletal:  Left leg: No swelling noted. Tender to palpation over left groin/adductor muscle area. ROM limited d/t pain. Antalgic gait noted. Negative SLR. No pain with hip internal/external rotation.  Neurological: She is alert and oriented to person, place, and time. No sensory deficit.  Nursing note and vitals reviewed.      Assessment & Plan:  Strain of left hip adductor muscle, initial encounter  Recommend rx strength anti-inflammatory for several days. Encouraged her to do gentle stretching and remain as active as possible. Pain will  likely take several weeks to fully resolve, but should notice improvement each week. If she is not noticing improvement with lodine rx over the next couple weeks she will f/u, may consider referral to PT at that time. She verbalized understanding.  Dr. Richardson Landry was consulted on this case, he also examined the pt and is in agreement with the above treatment plan.

## 2018-10-24 ENCOUNTER — Ambulatory Visit: Payer: BLUE CROSS/BLUE SHIELD | Admitting: Family Medicine

## 2018-10-24 ENCOUNTER — Encounter: Payer: Self-pay | Admitting: Family Medicine

## 2018-10-24 VITALS — BP 132/80 | Temp 98.4°F | Ht 67.0 in | Wt 332.4 lb

## 2018-10-24 DIAGNOSIS — Z23 Encounter for immunization: Secondary | ICD-10-CM | POA: Diagnosis not present

## 2018-10-24 DIAGNOSIS — J310 Chronic rhinitis: Secondary | ICD-10-CM

## 2018-10-24 DIAGNOSIS — J329 Chronic sinusitis, unspecified: Secondary | ICD-10-CM | POA: Diagnosis not present

## 2018-10-24 MED ORDER — TRIAMTERENE-HCTZ 37.5-25 MG PO CAPS: ORAL_CAPSULE | ORAL | 6 refills | Status: DC

## 2018-10-24 MED ORDER — CEFDINIR 300 MG PO CAPS: 300.0000 mg | ORAL_CAPSULE | Freq: Two times a day (BID) | ORAL | 0 refills | Status: DC

## 2018-10-24 NOTE — Progress Notes (Signed)
   Subjective:    Patient ID: Kimberly Kennedy, female    DOB: June 20, 1986, 32 y.o.   MRN: [id number]  Sinusitis  This is a new problem. The current episode started yesterday. Associated symptoms include congestion, coughing and headaches. (Ears itchy) Treatments tried: allegra d, mucinex.   Would like flu vaccine today.      Numerous clinents sixk over the pas t pmonth  exposded to these    Frontal tenderness/worse with cough.  Sharp at times.  Achy at other times.  No significant radiation  group   Home      Allegra d plsu mucinex yesterday   Review of Systems  HENT: Positive for congestion.   Respiratory: Positive for cough.   Neurological: Positive for headaches.       Objective:   Physical Exam  Alert, mild malaise. Hydration good Vitals stable. frontal/ maxillary tenderness evident positive nasal congestion. pharynx normal neck supple  lungs clear/no crackles or wheezes. heart regular in rhythm       Assessment & Plan:  Impression rhinosinusitis likely post viral, discussed with patient. plan antibiotics prescribed. Questions answered. Symptomatic care discussed. warning signs discussed. WSL

## 2018-12-20 ENCOUNTER — Ambulatory Visit: Payer: BLUE CROSS/BLUE SHIELD | Admitting: Family Medicine

## 2018-12-20 ENCOUNTER — Encounter: Payer: Self-pay | Admitting: Family Medicine

## 2018-12-20 VITALS — BP 136/80 | Temp 98.8°F | Ht 67.0 in | Wt 332.4 lb

## 2018-12-20 DIAGNOSIS — J019 Acute sinusitis, unspecified: Secondary | ICD-10-CM | POA: Diagnosis not present

## 2018-12-20 DIAGNOSIS — B9689 Other specified bacterial agents as the cause of diseases classified elsewhere: Secondary | ICD-10-CM | POA: Diagnosis not present

## 2018-12-20 MED ORDER — AMOXICILLIN-POT CLAVULANATE 875-125 MG PO TABS: 1.0000 | ORAL_TABLET | Freq: Two times a day (BID) | ORAL | 0 refills | Status: DC

## 2018-12-20 NOTE — Progress Notes (Signed)
   Subjective:    Patient ID: Kimberly Kennedy, female    DOB: [birth year], 33 y.o.   MRN: [id number]  Cough  This is a new problem. Episode onset: one week. Associated symptoms include rhinorrhea. Pertinent negatives include no chest pain, ear pain, fever, shortness of breath or wheezing. Associated symptoms comments: Congestion, chest tightness this morning and sob when coughing.   This is been going on for over a week has had head congestion drainage coughing now with sinus pressure not feeling good states energy level subpar denies wheezing difficulty breathing   Review of Systems  Constitutional: Negative for activity change and fever.  HENT: Positive for congestion and rhinorrhea. Negative for ear pain.   Eyes: Negative for discharge.  Respiratory: Positive for cough. Negative for shortness of breath and wheezing.   Cardiovascular: Negative for chest pain.       Objective:   Physical Exam Vitals signs and nursing note reviewed.  Constitutional:      Appearance: She is well-developed.  HENT:     Head: Normocephalic.     Nose: Nose normal.     Mouth/Throat:     Pharynx: No oropharyngeal exudate.  Neck:     Musculoskeletal: Neck supple.  Cardiovascular:     Rate and Rhythm: Normal rate.     Heart sounds: Normal heart sounds. No murmur.  Pulmonary:     Effort: Pulmonary effort is normal.     Breath sounds: Normal breath sounds. No wheezing.  Lymphadenopathy:     Cervical: No cervical adenopathy.  Skin:    General: Skin is warm and dry.           Assessment & Plan:  Persistent viral illness supportive measures discussed Secondary rhinosinusitis antibiotics prescribed warning signs discussed follow-up if ongoing troubles

## 2019-07-13 ENCOUNTER — Other Ambulatory Visit: Payer: Self-pay

## 2019-07-13 ENCOUNTER — Encounter (HOSPITAL_COMMUNITY): Payer: Self-pay | Admitting: Emergency Medicine

## 2019-07-13 ENCOUNTER — Emergency Department (HOSPITAL_COMMUNITY)
Admission: EM | Admit: 2019-07-13 | Discharge: 2019-07-13 | Disposition: A | Discharge status: Home / Self Care | Payer: BLUE CROSS/BLUE SHIELD | Attending: Emergency Medicine | Admitting: Emergency Medicine

## 2019-07-13 DIAGNOSIS — R1033 Periumbilical pain: Secondary | ICD-10-CM | POA: Diagnosis not present

## 2019-07-13 LAB — URINALYSIS, ROUTINE W REFLEX MICROSCOPIC
Bacteria, UA: NONE SEEN
Bilirubin Urine: NEGATIVE
Glucose, UA: NEGATIVE mg/dL
Ketones, ur: NEGATIVE mg/dL
Leukocytes,Ua: NEGATIVE
Nitrite: NEGATIVE
Protein, ur: NEGATIVE mg/dL
Specific Gravity, Urine: 1.023 (ref 1.005–1.030)
pH: 6 (ref 5.0–8.0)

## 2019-07-13 LAB — COMPREHENSIVE METABOLIC PANEL
ALT: 15 U/L (ref 0–44)
AST: 15 U/L (ref 15–41)
Albumin: 3.8 g/dL (ref 3.5–5.0)
Alkaline Phosphatase: 47 U/L (ref 38–126)
Anion gap: 7 (ref 5–15)
BUN: 13 mg/dL (ref 6–20)
CO2: 25 mmol/L (ref 22–32)
Calcium: 8.7 mg/dL — ABNORMAL LOW (ref 8.9–10.3)
Chloride: 106 mmol/L (ref 98–111)
Creatinine, Ser: 0.76 mg/dL (ref 0.44–1.00)
GFR calc Af Amer: >60 mL/min (ref >60)
GFR calc non Af Amer: >60 mL/min (ref >60)
Glucose, Bld: 112 mg/dL — ABNORMAL HIGH (ref 70–99)
Potassium: 4 mmol/L (ref 3.5–5.1)
Sodium: 138 mmol/L (ref 135–145)
Total Bilirubin: 0.3 mg/dL (ref 0.3–1.2)
Total Protein: 7.4 g/dL (ref 6.5–8.1)

## 2019-07-13 LAB — CBC
HCT: 30.8 % — ABNORMAL LOW (ref 36.0–46.0)
Hemoglobin: 9.1 g/dL — ABNORMAL LOW (ref 12.0–15.0)
MCH: 21.1 pg — ABNORMAL LOW (ref 26.0–34.0)
MCHC: 29.5 g/dL — ABNORMAL LOW (ref 30.0–36.0)
MCV: 71.3 fL — ABNORMAL LOW (ref 80.0–100.0)
Platelets: 296 10*3/uL (ref 150–400)
RBC: 4.32 MIL/uL (ref 3.87–5.11)
RDW: 19 % — ABNORMAL HIGH (ref 11.5–15.5)
WBC: 7.2 10*3/uL (ref 4.0–10.5)
nRBC: 0 % (ref 0.0–0.2)

## 2019-07-13 LAB — LIPASE, BLOOD: Lipase: 34 U/L (ref 11–51)

## 2019-07-13 LAB — PREGNANCY, URINE: Preg Test, Ur: NEGATIVE

## 2019-07-13 NOTE — ED Notes (Signed)
Ginger ale provided for pt for fluid challenge

## 2019-07-13 NOTE — ED Provider Notes (Signed)
[facility] EMERGENCY DEPARTMENT Provider Note   CSN: [id number] Arrival date & time: 07/13/19  3976     History   Chief Complaint Chief Complaint  Patient presents with  . Abdominal Pain    HPI Kimberly Kennedy is a 33 y.o. female.     HPI  Patient is a 33 yo female with a PMH of fibroids presenting for periumbilical abdominal pain.  Patient reports that around 6 to 6:30 AM she had some 3 to 4-second sharp periumbilical pain that felt crampy.  This occurred approximately x4.  She has not had any pain in approximately an hour.  Patient reports she felt nauseous but did not vomit.  She had a normal bowel movement this morning without melena or hematochezia however she continued to have the sharp pain afterwards.  Patient denies dysuria, urgency, frequency or increased vaginal discharge.  Denies fever or chills. Denies pelvic pain.  She reports she is not sexually active and denies chance of pregnancy.  Patient reports she had a similar episode several months ago in the fall and presented to the emergency department had a CT scan at that time.  She was told she may have fibroids on her ovaries but did not have follow up with pelvic MRI.  Patient reports symptoms are completely resolved at present.  History reviewed. No pertinent past medical history.  There are no active problems to display for this patient.   History reviewed. No pertinent surgical history.   OB History   No obstetric history on file.      Home Medications    Prior to Admission medications   Medication Sig Start Date End Date Taking? Authorizing Provider  ibuprofen (ADVIL) 200 MG tablet Take 200 mg by mouth 2 (two) times daily.   Yes [provider]  Polyethylene Glycol 400 (BLINK TEARS) 0.25 % SOLN Apply 1 drop to eye daily as needed. For dry eye relief   Yes [provider]  triamterene-hydrochlorothiazide (DYAZIDE) 37.5-25 MG capsule One po up to three times a week for edema as needed  10/24/18  Yes Mikey Kirschner, MD  amoxicillin-clavulanate (AUGMENTIN) 875-125 MG tablet Take 1 tablet by mouth 2 (two) times daily. Patient not taking: Reported on 07/13/2019 12/20/18   Kathyrn Drown, MD    Family History History reviewed. No pertinent family history.  Social History Social History   Tobacco Use  . Smoking status: Never Smoker  . Smokeless tobacco: Never Used  Substance Use Topics  . Alcohol use: No  . Drug use: No     Allergies   Bactrim [sulfamethoxazole-trimethoprim]   Review of Systems Review of Systems  Constitutional: Negative for chills and fever.  HENT: Negative for congestion and sore throat.   Eyes: Negative for visual disturbance.  Respiratory: Negative for cough, chest tightness and shortness of breath.   Cardiovascular: Negative for chest pain and palpitations.  Gastrointestinal: Negative for abdominal pain, nausea and vomiting.  Genitourinary: Negative for dysuria and flank pain.  Musculoskeletal: Negative for back pain and myalgias.  Skin: Negative for rash.  Neurological: Negative for dizziness, syncope, light-headedness and headaches.     Physical Exam Updated Vital Signs BP (!) 164/105 (BP Location: Right Arm)   Pulse 61   Temp 98 F (36.7 C) (Oral)   Resp 16   Ht 5\' 7"  (1.702 m)   Wt (!) 147.4 kg   LMP 07/10/2019   SpO2 100%   BMI 50.90 kg/m   Physical Exam Vitals signs and  nursing note reviewed.  Constitutional:      General: She is not in acute distress.    Appearance: She is well-developed. She is obese.  HENT:     Head: Normocephalic and atraumatic.  Eyes:     Conjunctiva/sclera: Conjunctivae normal.     Pupils: Pupils are equal, round, and reactive to light.  Neck:     Musculoskeletal: Normal range of motion and neck supple.  Cardiovascular:     Rate and Rhythm: Normal rate and regular rhythm.     Heart sounds: S1 normal and S2 normal. No murmur.  Pulmonary:     Effort: Pulmonary effort is normal.      Breath sounds: Normal breath sounds. No wheezing or rales.  Abdominal:     General: Bowel sounds are normal. There is no distension.     Palpations: Abdomen is soft.     Tenderness: There is no abdominal tenderness. There is no guarding or rebound.  Musculoskeletal: Normal range of motion.        General: No deformity.  Lymphadenopathy:     Cervical: No cervical adenopathy.  Skin:    General: Skin is warm and dry.     Findings: No erythema or rash.  Neurological:     Mental Status: She is alert.     Comments: Cranial nerves grossly intact. Patient moves extremities symmetrically and with good coordination.  Psychiatric:        Behavior: Behavior normal.        Thought Content: Thought content normal.        Judgment: Judgment normal.      ED Treatments / Results  Labs (all labs ordered are listed, but only abnormal results are displayed) Labs Reviewed  COMPREHENSIVE METABOLIC PANEL - Abnormal; Notable for the following components:      Result Value   Glucose, Bld 112 (*)    Calcium 8.7 (*)    All other components within normal limits  CBC - Abnormal; Notable for the following components:   Hemoglobin 9.1 (*)    HCT 30.8 (*)    MCV 71.3 (*)    MCH 21.1 (*)    MCHC 29.5 (*)    RDW 19.0 (*)    All other components within normal limits  URINALYSIS, ROUTINE W REFLEX MICROSCOPIC - Abnormal; Notable for the following components:   Hgb urine dipstick MODERATE (*)    All other components within normal limits  LIPASE, BLOOD  PREGNANCY, URINE    EKG None  Radiology No results found.  Procedures Procedures (including critical care time)  Medications Ordered in ED Medications - No data to display   Initial Impression / Assessment and Plan / ED Course  I have reviewed the triage vital signs and the nursing notes.  Pertinent labs & imaging results that were available during my care of the patient were reviewed by me and considered in my medical decision making (see  chart for details).  Clinical Course as of Jul 13 1411  Sun Jul 13, 2019  1231 Hemoglobin(!): 9.1 [AM]  1318 Pt is currently  menstruating.  Hgb urine dipstickMarland Kitchen): MODERATE [AM]    Clinical Course User Index [AM] Albesa Seen, PA-C       This is a well-appearing 33 year old female with a past medical history of fibroids presenting for periumbilical abdominal pain.  Patient reports that it is fleeting and is occurred 3-4 times this morning.  She has not had any pain x1 hour and she has a completely  normal abdominal exam.  She is hemodynamically stable here in the emergency department.  Work-up demonstrating stable hemoglobin of 9.1, microcytic anemia.  This is stable over time.  No significant abnormalities on comprehensive metabolic panel.  Patient is not pregnant.  Urinalysis with moderate hemoglobinuria however patient is currently menstruating.  Given the lack of abdominal pain here in the emergency department on exam, do not feel that imaging would be of high utility.  She is tolerating p.o. and in no acute distress.  I did review previous records which show that she had ovarian masses favoring fibroids, recommend nonemergent pelvic MRI and I encouraged the patient to continue to follow-up with this.  She is to follow-up with her primary care provider.  Patient also with elevated blood pressure today and encouraged to recheck.  Return precautions given for any worsening pain, intractable nausea or vomiting.  Patient is in understanding and agrees with plan of care.  Final Clinical Impressions(s) / ED Diagnoses   Final diagnoses:  Periumbilical abdominal pain    ED Discharge Orders    None       Albesa Seen, PA-C 07/13/19 Cheri Kearns, DO 07/14/19 0901

## 2019-07-13 NOTE — Discharge Instructions (Signed)
Please see the information and instructions below regarding your visit.  Your diagnoses today include:  1. Periumbilical abdominal pain     Your exam and testing today is reassuring that there is not a condition causing your abdominal pain that we immediately need to intervene on at this time.   Abdominal (belly) pain can be caused by many things. Your caregiver performed an examination and possibly ordered blood/urine tests and imaging (CT scan, x-rays, ultrasound). Many cases can be observed and treated at home after initial evaluation in the emergency department. Even though you are being discharged home, abdominal pain can be unpredictable. Therefore, you need a repeated exam if your pain does not resolve, returns, or worsens. Most patients with abdominal pain don't have to be admitted to the hospital or have surgery, but serious problems like appendicitis and gallbladder attacks can start out as nonspecific pain. Many abdominal conditions cannot be diagnosed in one visit, so follow-up evaluations are very important.  Tests performed today include: Blood counts and electrolytes Blood tests to check liver and kidney function Blood tests to check pancreas function Urine test to look for infection and pregnancy (in women) Vital signs. See below for your results today.   See side panel of your discharge paperwork for testing performed today. Vital signs are listed at the bottom of these instructions.   Medications prescribed:    Take any prescribed medications only as prescribed, and any over the counter medications only as directed on the packaging.  Home care instructions:  Tr y eating, but start with foods that have a lot of fluid in them. Good examples are soup, Jell-O, and popsicles. If you do OK with those foods, you can try soft, bland foods, such as plain yogurt. Foods that are high in carbohydrates ("carbs"), like bread or saltine crackers, can help settle your stomach. Some people  also find that ginger helps with nausea. You should avoid foods that have a lot of fat in them. They can make nausea worse. Call your doctor if your symptoms come back when you try to eat.  Please follow any educational materials contained in this packet.   Follow-up instructions: Please follow-up with your primary care provider in 1-2 weeks for further evaluation of your symptoms if they are not completely improved.   Return instructions:  Please return to the Emergency Department if you experience worsening symptoms.  SEEK IMMEDIATE MEDICAL ATTENTION IF: The pain does not go away or becomes severe  A temperature above 101F develops  Repeated vomiting occurs (multiple episodes)  The pain becomes localized to portions of the abdomen. The right side could possibly be appendicitis. In an adult, the left lower portion of the abdomen could be colitis or diverticulitis.  Blood is being passed in stools or vomit (bright red or black tarry stools)  You develop chest pain, difficulty breathing, dizziness or fainting, or become confused, poorly responsive, or inconsolable (young children) If you have any other emergent concerns regarding your health  Additional Information:   Your vital signs today were: BP (!) 164/105 (BP Location: Right Arm)    Pulse 61    Temp 98 F (36.7 C) (Oral)    Resp 16    Ht 5\' 7"  (1.702 m)    Wt (!) 147.4 kg    LMP 07/10/2019    SpO2 100%    BMI 50.90 kg/m  If your blood pressure (BP) was elevated on multiple readings during this visit above 130 for the top number or  above 80 for the bottom number, please have this repeated by your primary care provider within one month. --------------  Thank you for allowing [place] to participate in your care today.

## 2019-07-13 NOTE — ED Triage Notes (Signed)
Pt reports abdominal pain with nausea that woke her up this morning. No vomiting, 1 episode of diarrhea.

## 2019-10-10 ENCOUNTER — Other Ambulatory Visit: Payer: Self-pay

## 2019-10-11 ENCOUNTER — Other Ambulatory Visit: Payer: BLUE CROSS/BLUE SHIELD

## 2019-10-11 ENCOUNTER — Other Ambulatory Visit (INDEPENDENT_AMBULATORY_CARE_PROVIDER_SITE_OTHER): Payer: BLUE CROSS/BLUE SHIELD

## 2019-10-11 DIAGNOSIS — Z23 Encounter for immunization: Secondary | ICD-10-CM

## 2019-10-16 ENCOUNTER — Encounter: Payer: Self-pay | Admitting: Family Medicine

## 2020-01-05 ENCOUNTER — Encounter: Payer: Self-pay | Admitting: Family Medicine

## 2020-01-06 ENCOUNTER — Encounter: Payer: Self-pay | Admitting: Family Medicine

## 2020-01-08 DIAGNOSIS — Z23 Encounter for immunization: Secondary | ICD-10-CM | POA: Diagnosis not present

## 2020-02-09 DIAGNOSIS — Z23 Encounter for immunization: Secondary | ICD-10-CM | POA: Diagnosis not present

## 2020-06-24 ENCOUNTER — Other Ambulatory Visit: Payer: Self-pay | Admitting: Family Medicine

## 2020-06-24 NOTE — Telephone Encounter (Signed)
Pt schedule appt 08/24. Will she need lab work?

## 2020-06-24 NOTE — Telephone Encounter (Signed)
LVM to schedule appt

## 2020-06-24 NOTE — Telephone Encounter (Signed)
Please schedule appt then route back to nurses.  Last seen for this med in 2018 and last labs ordered by dr Richardson Landry was 2018  Lipid, liver, tsh, bmp, cbc, tibc, ferritin

## 2020-07-27 ENCOUNTER — Encounter: Payer: Self-pay | Admitting: Family Medicine

## 2020-07-27 ENCOUNTER — Other Ambulatory Visit: Payer: Self-pay

## 2020-07-27 ENCOUNTER — Ambulatory Visit (INDEPENDENT_AMBULATORY_CARE_PROVIDER_SITE_OTHER): Payer: BLUE CROSS/BLUE SHIELD | Admitting: Family Medicine

## 2020-07-27 VITALS — BP 132/82 | HR 77 | Temp 96.5°F | Ht 67.0 in | Wt 348.6 lb

## 2020-07-27 DIAGNOSIS — R600 Localized edema: Secondary | ICD-10-CM | POA: Diagnosis not present

## 2020-07-27 DIAGNOSIS — Z6841 Body Mass Index (BMI) 40.0 and over, adult: Secondary | ICD-10-CM

## 2020-07-27 DIAGNOSIS — D649 Anemia, unspecified: Secondary | ICD-10-CM | POA: Diagnosis not present

## 2020-07-27 DIAGNOSIS — Z13220 Encounter for screening for lipoid disorders: Secondary | ICD-10-CM

## 2020-07-27 DIAGNOSIS — Z86018 Personal history of other benign neoplasm: Secondary | ICD-10-CM | POA: Diagnosis not present

## 2020-07-27 MED ORDER — TRIAMTERENE-HCTZ 37.5-25 MG PO CAPS: 1.0000 | ORAL_CAPSULE | Freq: Every day | ORAL | 0 refills | Status: DC

## 2020-07-27 NOTE — Progress Notes (Signed)
Patient ID: Kimberly Kennedy, female    DOB: [birth year], 34 y.o.   MRN: [id number]   Chief Complaint  Patient presents with  . Med Check    leg swelling   Subjective:    HPI Pt here for medication check up for HTN and leg swelling. Pt is taking Dyazide 37.5-25 mg up to three times a week for edema has been on this since 2018. Pt is having some swelling. Left ankle swells worse than right ankle sometimes.  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  Not using compression stockings.  Taking MWF usually for dyazide. Feeling left leg more swelling than right.  No pain in calf, redness, or warmth.    Last labs in 8/20- had anemia Hb 9.1 and hct 30.8.  Told she has IDA.  Pt not taking iron daily.  Has had this at least since 2018. Hb around 9.0.  Pt stating it caused constipation and pt stopped the iron tabs.   Medical History Kimberly Kennedy has no past medical history on file.   Outpatient Encounter Medications as of 07/27/2020  Medication Sig  . ibuprofen (ADVIL) 200 MG tablet Take 200 mg by mouth 2 (two) times daily.  . Polyethylene Glycol 400 (BLINK TEARS) 0.25 % SOLN Apply 1 drop to eye daily as needed. For dry eye relief  . triamterene-hydrochlorothiazide (DYAZIDE) 37.5-25 MG capsule Take 1 each (1 capsule total) by mouth daily.  . [DISCONTINUED] triamterene-hydrochlorothiazide (DYAZIDE) 37.5-25 MG capsule TAKE 1 CAPSULE BY MOUTH UP TO THREE TIMES A WEEK FOR EDEMA.  . [DISCONTINUED] amoxicillin-clavulanate (AUGMENTIN) 875-125 MG tablet Take 1 tablet by mouth 2 (two) times daily. (Patient not taking: Reported on 07/13/2019)   No facility-administered encounter medications on file as of 07/27/2020.     Review of Systems  Constitutional: Negative for chills and fever.  HENT: Negative for congestion, rhinorrhea and sore throat.   Respiratory: Negative for cough, shortness of breath and wheezing.   Cardiovascular: Positive for leg swelling (left greater than right). Negative for  chest pain.  Gastrointestinal: Negative for abdominal pain, diarrhea, nausea and vomiting.  Genitourinary: Negative for dysuria and frequency.  Musculoskeletal: Negative for arthralgias and back pain.  Skin: Negative for rash.  Neurological: Negative for dizziness, weakness and headaches.     Vitals BP 132/82   Pulse 77   Temp (!) 96.5 F (35.8 C)   Ht '5\' 7"'  (1.702 m)   Wt (!) 348 lb 9.6 oz (158.1 kg)   BMI 54.60 kg/m   Objective:   Physical Exam Vitals and nursing note reviewed.  Constitutional:      General: She is not in acute distress.    Appearance: Normal appearance. She is obese. She is not ill-appearing.  HENT:     Head: Normocephalic and atraumatic.     Nose: Nose normal.     Mouth/Throat:     Mouth: Mucous membranes are moist.     Pharynx: Oropharynx is clear.  Eyes:     Extraocular Movements: Extraocular movements intact.     Conjunctiva/sclera: Conjunctivae normal.     Pupils: Pupils are equal, round, and reactive to light.  Cardiovascular:     Rate and Rhythm: Normal rate and regular rhythm.     Pulses: Normal pulses.     Heart sounds: Normal heart sounds.  Pulmonary:     Effort: Pulmonary effort is normal.     Breath sounds: Normal breath sounds. No wheezing, rhonchi or rales.  Musculoskeletal:  General: Swelling present. No tenderness. Normal range of motion.     Right lower leg: Edema present.     Left lower leg: Edema present.     Comments: Left greater than right, +1 pitting edema. No erythema, warmth or rash.  Neg homan's sign.  Skin:    General: Skin is warm and dry.     Findings: No lesion or rash.  Neurological:     General: No focal deficit present.     Mental Status: She is alert and oriented to person, place, and time.     Cranial Nerves: No cranial nerve deficit.     Motor: No weakness.  Psychiatric:        Mood and Affect: Mood normal.        Behavior: Behavior normal.      Assessment and Plan   1. Lower leg edema -  CMP14+EGFR  2. History of uterine fibroid  3. Anemia, unspecified type - CBC  4. Lipid screening - Lipid panel  5. Class 3 severe obesity due to excess calories with body mass index (BMI) of 50.0 to 59.9 in adult, unspecified whether serious comorbidity present ([place])   lmp-07/16/20 Ordered labs today.  Call pt with labs.  Edema and elevated bp- take dyazide daily to help with swelling and bp.  Advising compression stockings when working.  Obesity- Pt to work on increasing exercise and watch her diet.  F/u 2 months for recheck of legs and bp.

## 2020-07-28 ENCOUNTER — Other Ambulatory Visit: Payer: Self-pay | Admitting: *Deleted

## 2020-07-28 DIAGNOSIS — R600 Localized edema: Secondary | ICD-10-CM

## 2020-07-28 DIAGNOSIS — D649 Anemia, unspecified: Secondary | ICD-10-CM

## 2020-07-28 LAB — CBC
Hematocrit: 32 % — ABNORMAL LOW (ref 34.0–46.6)
Hemoglobin: 9.3 g/dL — ABNORMAL LOW (ref 11.1–15.9)
MCH: 20.6 pg — ABNORMAL LOW (ref 26.6–33.0)
MCHC: 29.1 g/dL — ABNORMAL LOW (ref 31.5–35.7)
MCV: 71 fL — ABNORMAL LOW (ref 79–97)
Platelets: 353 10*3/uL (ref 150–450)
RBC: 4.52 x10E6/uL (ref 3.77–5.28)
RDW: 17.9 % — ABNORMAL HIGH (ref 11.7–15.4)
WBC: 7.5 10*3/uL (ref 3.4–10.8)

## 2020-07-28 LAB — LIPID PANEL
Chol/HDL Ratio: 3 ratio (ref 0.0–4.4)
Cholesterol, Total: 137 mg/dL (ref 100–199)
HDL: 46 mg/dL (ref >39)
LDL Chol Calc (NIH): 77 mg/dL (ref 0–99)
Triglycerides: 67 mg/dL (ref 0–149)
VLDL Cholesterol Cal: 14 mg/dL (ref 5–40)

## 2020-07-28 LAB — CMP14+EGFR
ALT: 13 IU/L (ref 0–32)
AST: 12 IU/L (ref 0–40)
Albumin/Globulin Ratio: 1.4 (ref 1.2–2.2)
Albumin: 4.3 g/dL (ref 3.8–4.8)
Alkaline Phosphatase: 62 IU/L (ref 48–121)
BUN/Creatinine Ratio: 18 (ref 9–23)
BUN: 14 mg/dL (ref 6–20)
Bilirubin Total: 0.2 mg/dL (ref 0.0–1.2)
CO2: 23 mmol/L (ref 20–29)
Calcium: 9.5 mg/dL (ref 8.7–10.2)
Chloride: 102 mmol/L (ref 96–106)
Creatinine, Ser: 0.79 mg/dL (ref 0.57–1.00)
GFR calc Af Amer: 111 mL/min/{1.73_m2} (ref >59)
GFR calc non Af Amer: 97 mL/min/{1.73_m2} (ref >59)
Globulin, Total: 3 g/dL (ref 1.5–4.5)
Glucose: 97 mg/dL (ref 65–99)
Potassium: 4.7 mmol/L (ref 3.5–5.2)
Sodium: 138 mmol/L (ref 134–144)
Total Protein: 7.3 g/dL (ref 6.0–8.5)

## 2020-09-10 DIAGNOSIS — M79642 Pain in left hand: Secondary | ICD-10-CM | POA: Diagnosis not present

## 2020-09-10 DIAGNOSIS — Z6841 Body Mass Index (BMI) 40.0 and over, adult: Secondary | ICD-10-CM | POA: Diagnosis not present

## 2020-09-11 ENCOUNTER — Other Ambulatory Visit (INDEPENDENT_AMBULATORY_CARE_PROVIDER_SITE_OTHER): Payer: BLUE CROSS/BLUE SHIELD

## 2020-09-11 DIAGNOSIS — Z23 Encounter for immunization: Secondary | ICD-10-CM | POA: Diagnosis not present

## 2020-09-22 ENCOUNTER — Ambulatory Visit: Payer: BLUE CROSS/BLUE SHIELD | Admitting: Family Medicine

## 2020-09-29 ENCOUNTER — Other Ambulatory Visit: Payer: Self-pay

## 2020-09-29 ENCOUNTER — Encounter: Payer: Self-pay | Admitting: Family Medicine

## 2020-09-29 ENCOUNTER — Ambulatory Visit (INDEPENDENT_AMBULATORY_CARE_PROVIDER_SITE_OTHER): Payer: BLUE CROSS/BLUE SHIELD | Admitting: Family Medicine

## 2020-09-29 VITALS — HR 73 | Temp 97.8°F | Resp 18

## 2020-09-29 DIAGNOSIS — J0100 Acute maxillary sinusitis, unspecified: Secondary | ICD-10-CM

## 2020-09-29 DIAGNOSIS — R059 Cough, unspecified: Secondary | ICD-10-CM

## 2020-09-29 MED ORDER — AMOXICILLIN 500 MG PO CAPS: 500.0000 mg | ORAL_CAPSULE | Freq: Three times a day (TID) | ORAL | 0 refills | Status: DC

## 2020-09-29 NOTE — Progress Notes (Deleted)
error 

## 2020-09-29 NOTE — Progress Notes (Signed)
Patient ID: Kimberly Kennedy, female    DOB: [birth year], 34 y.o.   MRN: [id number]   Chief Complaint  Patient presents with  . Sinus Problem    congestion, sneezing and sinus pressure since Sunday   Subjective:    HPI Patient stating having 3 days of cough, congestion, sneezing, watery eyes.  Having some intermittent sinus pressure.  Has been taking Mucinex and Allegra.  No sick contacts at home.  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  Has had both Covid vaccines.  No fever, ear pain, nausea vomiting diarrhea.  Patient stating she has a history of bronchitis.  No history of asthma.  Patient is a non-smoker. 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  Medical History Kimberly Kennedy has no past medical history on file.   Outpatient Encounter Medications as of 09/29/2020  Medication Sig  . amoxicillin (AMOXIL) 500 MG capsule Take 1 capsule (500 mg total) by mouth 3 (three) times daily.  Marland Kitchen ibuprofen (ADVIL) 200 MG tablet Take 200 mg by mouth 2 (two) times daily.  . Polyethylene Glycol 400 (BLINK TEARS) 0.25 % SOLN Apply 1 drop to eye daily as needed. For dry eye relief  . triamterene-hydrochlorothiazide (DYAZIDE) 37.5-25 MG capsule Take 1 each (1 capsule total) by mouth daily.   No facility-administered encounter medications on file as of 09/29/2020.     Review of Systems  Constitutional: Negative for chills and fever.  HENT: Positive for congestion, rhinorrhea, sinus pressure, sneezing and sore throat. Negative for ear pain and sinus pain.   Eyes: Positive for discharge. Negative for pain and itching.  Respiratory: Positive for cough (dry cough). Negative for chest tightness, shortness of breath and wheezing.   Gastrointestinal: Negative for constipation, diarrhea, nausea and vomiting.     Vitals Pulse 73   Temp 97.8 F (36.6 C) (Temporal)   Resp 18   SpO2 99%   Objective:   Physical Exam Vitals and nursing note reviewed.  Constitutional:       General: She is not in acute distress.    Appearance: Normal appearance. She is not ill-appearing or toxic-appearing.  HENT:     Head: Normocephalic and atraumatic.     Right Ear: Tympanic membrane, ear canal and external ear normal.     Left Ear: Tympanic membrane, ear canal and external ear normal.     Nose: Rhinorrhea (clear drainage) present. No congestion.     Mouth/Throat:     Mouth: Mucous membranes are moist.     Pharynx: Oropharynx is clear. No oropharyngeal exudate or posterior oropharyngeal erythema.  Eyes:     Extraocular Movements: Extraocular movements intact.     Conjunctiva/sclera: Conjunctivae normal.     Pupils: Pupils are equal, round, and reactive to light.  Cardiovascular:     Rate and Rhythm: Normal rate and regular rhythm.     Pulses: Normal pulses.     Heart sounds: Normal heart sounds. No murmur heard.   Pulmonary:     Effort: Pulmonary effort is normal. No respiratory distress.     Breath sounds: No wheezing, rhonchi or rales.  Chest:     Chest wall: No tenderness.  Musculoskeletal:     Cervical back: Normal range of motion.  Lymphadenopathy:     Cervical: No cervical adenopathy.  Skin:    General: Skin is warm and dry.     Findings: No rash.  Neurological:     Mental Status: She is alert and  oriented to person, place, and time.  Psychiatric:        Mood and Affect: Mood normal.        Behavior: Behavior normal.      Assessment and Plan   1. Acute non-recurrent maxillary sinusitis - amoxicillin (AMOXIL) 500 MG capsule; Take 1 capsule (500 mg total) by mouth 3 (three) times daily.  Dispense: 30 capsule; Refill: 0  2. Cough - Novel Coronavirus, NAA ([facility])   Advising symptomatic tx for viral illness. Reviewed usual course of viral illness vs. Sinusitis and antibiotic use.  Gave script for antibiotics to take, but reviewed that if it's viral illness it might not get better with abx and may still run course of 7-10 days.  increase fluids and  call if not improving in next 2-3 days.   Pt advised to get covid testing.   Advised to call back with results if positive for guidance. mucinex -bid with increase in fluids.  Flonase- for congestion. Tylenol/ibuprofen prn for bodyaches/fever.  Pt in agreement with plan. F/u prn.

## 2020-10-01 LAB — SARS-COV-2, NAA 2 DAY TAT

## 2020-10-01 LAB — NOVEL CORONAVIRUS, NAA: SARS-CoV-2, NAA: NOT DETECTED

## 2020-10-01 LAB — SPECIMEN STATUS REPORT

## 2020-10-04 ENCOUNTER — Telehealth: Payer: Self-pay

## 2020-10-04 ENCOUNTER — Other Ambulatory Visit: Payer: Self-pay | Admitting: *Deleted

## 2020-10-04 MED ORDER — FLUCONAZOLE 150 MG PO TABS: ORAL_TABLET | ORAL | 0 refills | Status: DC

## 2020-10-04 NOTE — Telephone Encounter (Signed)
Pt seen Dr Lovena Le last week was given antibiotic and now has a yeast infection and wanting to see if something can be called in for that. Sent to [facility]   Pt [phone]

## 2020-10-04 NOTE — Telephone Encounter (Signed)
Patient reports itching and irritation. Rx sent to [facility].

## 2020-10-06 ENCOUNTER — Ambulatory Visit: Payer: BLUE CROSS/BLUE SHIELD | Admitting: Family Medicine

## 2020-10-15 ENCOUNTER — Ambulatory Visit: Payer: BLUE CROSS/BLUE SHIELD | Admitting: Family Medicine

## 2020-11-03 ENCOUNTER — Ambulatory Visit: Payer: BLUE CROSS/BLUE SHIELD | Admitting: Family Medicine

## 2020-11-09 ENCOUNTER — Ambulatory Visit: Payer: BLUE CROSS/BLUE SHIELD | Admitting: Family Medicine

## 2020-11-09 ENCOUNTER — Other Ambulatory Visit: Payer: Self-pay

## 2020-11-09 ENCOUNTER — Telehealth: Payer: BLUE CROSS/BLUE SHIELD | Admitting: Family Medicine

## 2020-11-10 ENCOUNTER — Telehealth: Payer: Self-pay | Admitting: *Deleted

## 2020-11-10 ENCOUNTER — Telehealth (INDEPENDENT_AMBULATORY_CARE_PROVIDER_SITE_OTHER): Payer: BLUE CROSS/BLUE SHIELD | Admitting: Family Medicine

## 2020-11-10 ENCOUNTER — Other Ambulatory Visit: Payer: Self-pay

## 2020-11-10 DIAGNOSIS — N920 Excessive and frequent menstruation with regular cycle: Secondary | ICD-10-CM | POA: Diagnosis not present

## 2020-11-10 DIAGNOSIS — N9489 Other specified conditions associated with female genital organs and menstrual cycle: Secondary | ICD-10-CM

## 2020-11-10 DIAGNOSIS — D508 Other iron deficiency anemias: Secondary | ICD-10-CM

## 2020-11-10 NOTE — Telephone Encounter (Signed)
Kimberly Kennedy, Kimberly Kennedy are scheduled for a virtual visit with your provider today.    Just as we do with appointments in the office, we must obtain your consent to participate.  Your consent will be active for this visit and any virtual visit you may have with one of our providers in the next 365 days.    If you have a MyChart account, I can also send a copy of this consent to you electronically.  All virtual visits are billed to your insurance company just like a traditional visit in the office.  As this is a virtual visit, video technology does not allow for your provider to perform a traditional examination.  This may limit your provider's ability to fully assess your condition.  If your provider identifies any concerns that need to be evaluated in person or the need to arrange testing such as labs, EKG, etc, we will make arrangements to do so.    Although advances in technology are sophisticated, we cannot ensure that it will always work on either your end or our end.  If the connection with a video visit is poor, we may have to switch to a telephone visit.  With either a video or telephone visit, we are not always able to ensure that we have a secure connection.   I need to obtain your verbal consent now.   Are you willing to proceed with your visit today?   Kimberly Kennedy has provided verbal consent on 11/10/2020 for a virtual visit (video or telephone).   Kimberly Na, RN 11/10/2020  10:53 AM

## 2020-11-10 NOTE — Progress Notes (Signed)
Patient ID: Kimberly Kennedy, female    DOB: 19-Apr-1986, 34 y.o.   MRN: [id number]   Virtual Visit via Video Note  I connected with Kimberly Kennedy on 11/10/20 at 10:40 AM EST by a video enabled telemedicine application and verified that I am speaking with the correct person using two identifiers.  Location: Patient: home Provider: office   I discussed the limitations of evaluation and management by telemedicine and the availability of in person appointments. The patient expressed understanding and agreed to proceed.   Chief Complaint  Patient presents with  . anemia follow up    started iron pills but then stopped- hasn't had blood work yet   Subjective:    HPI  Cc-f/u anemia.  Pt hasn't had the blood work yet. Pt did start the iron pills, but then stopped. Over weekend was on her menstrual cycle.  Missed a few wks on iron pills. More fatigue after cycle.   has not seen by gyn and went to ER back in 2019 for abd pain.  Had ct abdomen. On CT abd- Has bilateral solid adnexal masses 4.9 and rt and 3.5 cm on left possibly Pedunculated fibroids, at that time recommending non emergent mri of pelvis.  No pain in abdomen at this time. Never f/u with gyn.  Medical History Kimberly Kennedy has no past medical history on file.   Outpatient Encounter Medications as of 11/10/2020  Medication Sig  . amoxicillin (AMOXIL) 500 MG capsule Take 1 capsule (500 mg total) by mouth 3 (three) times daily. (Patient not taking: Reported on 11/10/2020)  . fluconazole (DIFLUCAN) 150 MG tablet Take 1 tablet now and 1 tablet 3 days later. (Patient not taking: Reported on 11/10/2020)  . ibuprofen (ADVIL) 200 MG tablet Take 200 mg by mouth 2 (two) times daily.  . Polyethylene Glycol 400 (BLINK TEARS) 0.25 % SOLN Apply 1 drop to eye daily as needed. For dry eye relief  . triamterene-hydrochlorothiazide (DYAZIDE) 37.5-25 MG capsule Take 1 each (1 capsule total) by mouth daily.   No facility-administered  encounter medications on file as of 11/10/2020.     Review of Systems  Constitutional: Negative for chills and fever.  HENT: Negative for congestion, rhinorrhea and sore throat.   Respiratory: Negative for cough, shortness of breath and wheezing.   Cardiovascular: Negative for chest pain and leg swelling.  Gastrointestinal: Positive for constipation. Negative for abdominal distention, abdominal pain, diarrhea, nausea and vomiting.  Genitourinary: Negative for dysuria and frequency.  Musculoskeletal: Negative for arthralgias and back pain.  Skin: Negative for rash.  Neurological: Negative for dizziness, weakness and headaches.     Vitals There were no vitals taken for this visit.  Objective:   Physical Exam  No PE due to phone visit.  Assessment and Plan   1. Other iron deficiency anemia - Ambulatory referral to Gynecology - US PELVIC COMPLETE WITH TRANSVAGINAL  2. Adnexal mass - Ambulatory referral to Gynecology - US PELVIC COMPLETE WITH TRANSVAGINAL  3. Menorrhagia with regular cycle - Ambulatory referral to Gynecology - US PELVIC COMPLETE WITH TRANSVAGINAL   H/o bilateral adenxa masses seen on ct in 2019, with heavy menstral periods. Pt with h/o IDA also.  Never seen by gyn at that time.  Referral to gyn and will order ultrasound of pelvis. Add colace otc with iron tablet.  Not taking iron daily. Didn't get labs due to pt thinking they haven't changed much. Will get labs next week.   F/u 3 mo.  Follow Up  Instructions:    I discussed the assessment and treatment plan with the patient. The patient was provided an opportunity to ask questions and all were answered. The patient agreed with the plan and demonstrated an understanding of the instructions.   The patient was advised to call back or seek an in-person evaluation if the symptoms worsen or if the condition fails to improve as anticipated.  I provided 15 minutes of non-face-to-face time during this  encounter.

## 2020-11-16 ENCOUNTER — Telehealth: Payer: Self-pay | Admitting: Family Medicine

## 2020-11-19 ENCOUNTER — Ambulatory Visit (HOSPITAL_COMMUNITY)
Admission: RE | Admit: 2020-11-19 | Discharge: 2020-11-19 | Disposition: A | Discharge status: Home / Self Care | Payer: BLUE CROSS/BLUE SHIELD | Source: Ambulatory Visit | Attending: Family Medicine | Admitting: Family Medicine

## 2020-11-19 ENCOUNTER — Other Ambulatory Visit: Payer: Self-pay

## 2020-11-19 DIAGNOSIS — R2241 Localized swelling, mass and lump, right lower limb: Secondary | ICD-10-CM | POA: Diagnosis not present

## 2020-11-19 DIAGNOSIS — N9489 Other specified conditions associated with female genital organs and menstrual cycle: Secondary | ICD-10-CM | POA: Diagnosis not present

## 2020-11-19 DIAGNOSIS — D508 Other iron deficiency anemias: Secondary | ICD-10-CM | POA: Insufficient documentation

## 2020-11-19 DIAGNOSIS — N920 Excessive and frequent menstruation with regular cycle: Secondary | ICD-10-CM | POA: Diagnosis not present

## 2020-11-19 DIAGNOSIS — R1909 Other intra-abdominal and pelvic swelling, mass and lump: Secondary | ICD-10-CM | POA: Diagnosis not present

## 2020-12-08 ENCOUNTER — Ambulatory Visit (INDEPENDENT_AMBULATORY_CARE_PROVIDER_SITE_OTHER): Payer: BLUE CROSS/BLUE SHIELD | Admitting: Advanced Practice Midwife

## 2020-12-08 ENCOUNTER — Other Ambulatory Visit: Payer: Self-pay | Admitting: *Deleted

## 2020-12-08 ENCOUNTER — Encounter: Payer: Self-pay | Admitting: Advanced Practice Midwife

## 2020-12-08 ENCOUNTER — Other Ambulatory Visit: Payer: Self-pay

## 2020-12-08 VITALS — BP 149/92 | HR 73 | Ht 67.0 in | Wt 348.2 lb

## 2020-12-08 DIAGNOSIS — I10 Essential (primary) hypertension: Secondary | ICD-10-CM

## 2020-12-08 DIAGNOSIS — N9489 Other specified conditions associated with female genital organs and menstrual cycle: Secondary | ICD-10-CM | POA: Diagnosis not present

## 2020-12-08 NOTE — Progress Notes (Signed)
   GYN VISIT Patient name: Kimberly Kennedy MRN [id number]  Date of birth: 09-09-1986 Chief Complaint:   Menorrhagia (Regular cycle/ u/s done 11-14-20)  History of Present Illness:   Kimberly Kennedy is a 35 y.o. No obstetric history on file. African American female being seen today for f/u eval of adnexal mass on pelvic u/s 11/19/20 which showed 6cm right adnexal mass (abd CT in 2019 showed bilat adnexal masses 5cm and 3.5cm, possibly pedunculated fibroids).  Reports reg cycles lasting 5d, requiring pad changes approx q 4h; feels a little tired during her cycle but no syncopal-like occurrences. Hgb 9.3 in Aug 2021 but didn't get further anemia w/u labs weren't run.  Depression screen [facility] 2/9 12/08/2020 07/27/2020 06/28/2017  Decreased Interest 0 0 0  Down, Depressed, Hopeless 0 0 0  PHQ - 2 Score 0 0 0  Altered sleeping 0 - -  Tired, decreased energy 0 - -  Change in appetite 0 - -  Feeling bad or failure about yourself  0 - -  Trouble concentrating 0 - -  Moving slowly or fidgety/restless 0 - -  Suicidal thoughts 0 - -  PHQ-9 Score 0 - -    Patient's last menstrual period was 12/03/2020. The current method of family planning is not discussed.  Last pap not discussed.  Review of Systems:   Pertinent items are noted in HPI Denies fever/chills, dizziness, headaches, visual disturbances, fatigue, shortness of breath, chest pain, abdominal pain, vomiting, abnormal vaginal discharge/itching/odor/irritation, problems with periods, bowel movements, urination, or intercourse unless otherwise stated above.  Pertinent History Reviewed:  Reviewed past medical,surgical, social, obstetrical and family history.  Reviewed problem list, medications and allergies. Physical Assessment:   Vitals:   12/08/20 1139  BP: (!) 149/92  Pulse: 73  Weight: (!) 348 lb 3.2 oz (157.9 kg)  Height: 5\' 7"  (1.702 m)  Body mass index is 54.54 kg/m.       Physical Examination:   General appearance: alert, well  appearing, and in no distress  Mental status: alert, oriented to person, place, and time  Skin: warm & dry   Cardiovascular: normal heart rate noted  Respiratory: normal respiratory effort, no distress  Abdomen: soft, non-tender   Pelvic: examination not indicated  Extremities: no edema    No results found for this or any previous visit (from the past 24 hour(s)).  Assessment & Plan:  1) Right adnexal mass 6cm> pelvic MRI with and w/o contrast ordered (msg to Daisy/Tish for scheduling) with f/u visit with [facility]  2) cHTN> on Dyazide  3) Anemia  4) Morbid obesity  Meds: No orders of the defined types were placed in this encounter.   Orders Placed This Encounter  Procedures  . MR PELVIS W WO CONTRAST    Return for msg sent to [facility] to sched pelvic MRI and gyn visit with [facility] to follow.  [facility] CNM 12/08/2020 12:36 PM

## 2020-12-10 ENCOUNTER — Ambulatory Visit (HOSPITAL_COMMUNITY): Payer: BLUE CROSS/BLUE SHIELD

## 2020-12-18 ENCOUNTER — Other Ambulatory Visit: Payer: Self-pay

## 2020-12-18 ENCOUNTER — Ambulatory Visit
Admission: RE | Admit: 2020-12-18 | Discharge: 2020-12-18 | Disposition: A | Discharge status: Home / Self Care | Payer: BLUE CROSS/BLUE SHIELD | Source: Ambulatory Visit | Attending: Obstetrics & Gynecology | Admitting: Obstetrics & Gynecology

## 2020-12-18 DIAGNOSIS — N9489 Other specified conditions associated with female genital organs and menstrual cycle: Secondary | ICD-10-CM

## 2020-12-18 DIAGNOSIS — D259 Leiomyoma of uterus, unspecified: Secondary | ICD-10-CM | POA: Diagnosis not present

## 2020-12-18 MED ORDER — GADOBENATE DIMEGLUMINE 529 MG/ML IV SOLN
20.0000 mL | Freq: Once | INTRAVENOUS | Status: AC | PRN
  Administered 2020-12-18: 20 mL via INTRAVENOUS

## 2020-12-27 ENCOUNTER — Encounter: Payer: Self-pay | Admitting: Obstetrics & Gynecology

## 2020-12-27 ENCOUNTER — Encounter: Payer: Self-pay | Admitting: Family Medicine

## 2020-12-27 ENCOUNTER — Ambulatory Visit (INDEPENDENT_AMBULATORY_CARE_PROVIDER_SITE_OTHER): Payer: BLUE CROSS/BLUE SHIELD | Admitting: Obstetrics & Gynecology

## 2020-12-27 ENCOUNTER — Ambulatory Visit: Payer: Self-pay | Admitting: Obstetrics & Gynecology

## 2020-12-27 VITALS — BP 150/93 | HR 78 | Ht 67.0 in | Wt 353.0 lb

## 2020-12-27 DIAGNOSIS — D251 Intramural leiomyoma of uterus: Secondary | ICD-10-CM | POA: Diagnosis not present

## 2020-12-27 DIAGNOSIS — D500 Iron deficiency anemia secondary to blood loss (chronic): Secondary | ICD-10-CM

## 2020-12-27 MED ORDER — MYFEMBREE 40-1-0.5 MG PO TABS: 1.0000 | ORAL_TABLET | Freq: Every day | ORAL | 11 refills | Status: DC

## 2020-12-27 NOTE — Progress Notes (Signed)
Follow up appointment for results  Chief Complaint  Patient presents with  . Follow-up    Blood pressure (!) 150/93, pulse 78, height 5\' 7"  (1.702 m), weight (!) 353 lb (160.1 kg), last menstrual period 12/03/2020.   MR PELVIS W WO CONTRAST  Result Date: 12/18/2020 CLINICAL DATA:  35 year old female with indeterminate solid right pelvic mass on recent ultrasound. EXAM: MRI PELVIS WITHOUT AND WITH CONTRAST TECHNIQUE: Multiplanar multisequence MR imaging of the pelvis was performed both before and after administration of intravenous contrast. CONTRAST:  61mL MULTIHANCE GADOBENATE DIMEGLUMINE 529 MG/ML IV SOLN COMPARISON:  11/19/2020 pelvic sonogram. 08/28/2018 CT abdomen/pelvis. FINDINGS: Urinary Tract:  Normal bladder.  Normal urethra. Bowel: Visualized small and large bowel are normal caliber with no bowel wall thickening. Vascular/Lymphatic: No pathologically enlarged lymph nodes in the pelvis. No acute vascular abnormality. Reproductive: Uterus: The anteverted myomatous uterus measures 8.9 x 3.7 x 5.4 cm. Exophytic enhancing uterine fibroids as follows: -right anterior uterine body exophytic 6.6 x 5.6 x 5.8 cm fibroid with fairly broad uterine attachment (no discrete stalk identified) -left fundal exophytic 3.3 x 2.6 x 2.5 cm fibroid Inner myometrium (junctional zone) measures 7 mm in thickness, which is within normal limits. Endometrium measures 15 mm in bilayer thickness, which is top-normal. No endometrial cavity fluid. There is a tiny 0.4 x 0.4 x 0.4 cm low T2 signal intensity filling defect in the left uterine cavity (series 4/image 20 and series 5/image 11), which could represent a tiny submucosal fibroid. Normal uterine cervix. Ovaries and Adnexa: The right ovary measures 3.2 x 2.2 x 2.3 cm and is normal. The left ovary measures 3.9 x 2.8 x 2.9 cm and contains a simple 2.4 x 2.2 x 2.0 cm follicular cyst. There are no suspicious ovarian or adnexal masses. Other: No abnormal free fluid in the  pelvis. No focal pelvic fluid collection. Musculoskeletal: No aggressive appearing focal osseous lesions. IMPRESSION: 1. No suspicious ovarian or adnexal masses. 2. Myomatous uterus with dominant 6.6 cm exophytic right anterior uterine body uterine fibroid. Possible tiny 0.4 x 0.4 x 0.4 cm submucosal fibroid in the left uterine cavity. Suggest attention on future follow-up pelvic ultrasound or sonohysterography studies. Electronically Signed   By: Ilona Sorrel M.D.   On: 12/18/2020 10:25   US PELVIC COMPLETE WITH TRANSVAGINAL  Result Date: 11/19/2020 CLINICAL DATA:  Bilateral adnexal masses EXAM: TRANSABDOMINAL AND TRANSVAGINAL ULTRASOUND OF PELVIS TECHNIQUE: Both transabdominal and transvaginal ultrasound examinations of the pelvis were performed. Transabdominal technique was performed for global imaging of the pelvis including uterus, ovaries, adnexal regions, and pelvic cul-de-sac. It was necessary to proceed with endovaginal exam following the transabdominal exam to visualize the uterus endometrium ovaries. COMPARISON:  CT 08/28/2018 FINDINGS: Uterus Measurements: 13.2 x 5.7 x 6.5 cm = volume: 254.6 mL. Small amount of fluid in the lower uterine segment and cervix. Mass abutting or arising from the right fundal region measuring 6.9 x 5.6 x 5.9 cm, previously measured 4.9 cm on CT. Endometrium Thickness: 5.1 mm.  No focal abnormality visualized. Right ovary Not discretely visualized Left ovary Measurements: 3.4 x 2.1 x 2.7 cm = volume: 10 mL. Normal appearance/no adnexal mass. Other findings No abnormal free fluid. IMPRESSION: 1. Study limited by habitus. 2. The left ovary appears within normal limits. 3. 6.9 cm solid mass in the right adnexa abutting or arising from the right uterine fundus. Findings could be secondary to exophytic/pedunculated fibroid, however solid right adnexal mass is also in the differential. Pelvic MRI was previously recommended and  may be again considered for further evaluation and  localization of the mass. Electronically Signed   By: Donavan Foil M.D.   On: 11/19/2020 16:10    MEDS ordered this encounter: Meds ordered this encounter  Medications  . Relugolix-Estradiol-Norethind (MYFEMBREE) 40-1-0.5 MG TABS    Sig: Take 1 tablet by mouth daily.    Dispense:  30 tablet    Refill:  11    Orders for this encounter: No orders of the defined types were placed in this encounter.   Impression:   ICD-10-CM   1. Intramural leiomyoma of uterus  D25.1   2. Iron deficiency anemia due to menstrua blood loss  D50.0      Plan: Trial of myfembree Follow up by MyChart 3 months Repeat sonogram 12 months to track size of the fibroid  Follow Up: Return in about 3 months (around 03/27/2021) for [place] visit, with Dr Elonda Husky.       Face to face time:  20 minutes  Greater than 50% of the visit time was spent in counseling and coordination of care with the patient.  The summary and outline of the counseling and care coordination is summarized in the note above.   All questions were answered.  History reviewed. No pertinent past medical history.  History reviewed. No pertinent surgical history.  OB History   No obstetric history on file.     Allergies  Allergen Reactions  . Bactrim [Sulfamethoxazole-Trimethoprim] Rash    Social History   Socioeconomic History  . Marital status: Single    Spouse name: Not on file  . Number of children: Not on file  . Years of education: 59  . Highest education level: Master's degree (e.g., MA, MS, MEng, MEd, MSW, MBA)  Occupational History  . Not on file  Tobacco Use  . Smoking status: Never Smoker  . Smokeless tobacco: Never Used  Vaping Use  . Vaping Use: Never used  Substance and Sexual Activity  . Alcohol use: Yes    Comment: occ.  . Drug use: No  . Sexual activity: Not Currently    Birth control/protection: None  Other Topics Concern  . Not on file  Social History Narrative  . Not on file    Social Determinants of Health   Financial Resource Strain: Low Risk   . Difficulty of Paying Living Expenses: Not hard at all  Food Insecurity: No Food Insecurity  . Worried About Charity fundraiser in the Last Year: Never true  . Ran Out of Food in the Last Year: Never true  Transportation Needs: No Transportation Needs  . Lack of Transportation (Medical): No  . Lack of Transportation (Non-Medical): No  Physical Activity: Inactive  . Days of Exercise per Week: 0 days  . Minutes of Exercise per Session: 0 min  Stress: No Stress Concern Present  . Feeling of Stress : Only a little  Social Connections: Unknown  . Frequency of Communication with Friends and Family: More than three times a week  . Frequency of Social Gatherings with Friends and Family: Twice a week  . Attends Religious Services: More than 4 times per year  . Active Member of Clubs or Organizations: Yes  . Attends Archivist Meetings: More than 4 times per year  . Marital Status: Not on file    Family History  Problem Relation Age of Onset  . Diabetes Mother   . Hypertension Mother   . Hypothyroidism Mother   . Breast cancer  Mother   . Hypertension Sister   . Rheum arthritis Sister

## 2020-12-31 ENCOUNTER — Other Ambulatory Visit: Payer: BLUE CROSS/BLUE SHIELD

## 2021-03-28 ENCOUNTER — Telehealth (INDEPENDENT_AMBULATORY_CARE_PROVIDER_SITE_OTHER): Payer: BLUE CROSS/BLUE SHIELD | Admitting: Obstetrics & Gynecology

## 2021-03-28 ENCOUNTER — Encounter: Payer: Self-pay | Admitting: Obstetrics & Gynecology

## 2021-03-28 DIAGNOSIS — D500 Iron deficiency anemia secondary to blood loss (chronic): Secondary | ICD-10-CM

## 2021-03-28 DIAGNOSIS — D251 Intramural leiomyoma of uterus: Secondary | ICD-10-CM

## 2021-03-28 DIAGNOSIS — N920 Excessive and frequent menstruation with regular cycle: Secondary | ICD-10-CM

## 2021-03-28 NOTE — Progress Notes (Signed)
   TELEHEALTH VIRTUAL GYNECOLOGY VISIT ENCOUNTER NOTE  Pt is at home I am at my office  Total time: 10 minutes  I connected with [name] on 03/28/21 at 10:30 AM EDT by MyChart video visit at home and verified that I am speaking with the correct person using two identifiers.   I discussed the limitations, risks, security and privacy concerns of performing an evaluation and management service by telephone and the availability of in person appointments. I also discussed with the patient that there may be a patient responsible charge related to this service. The patient expressed understanding and agreed to proceed.   History:  Kimberly Kennedy is a 35 y.o. G0P0000 female being evaluated today for response to the [facility] therapy. She denies any abnormal vaginal discharge, abnormal or heavy vaginal bleeding, pelvic pain or other concerns.       History reviewed. No pertinent past medical history. History reviewed. No pertinent surgical history. The following portions of the patient's history were reviewed and updated as appropriate: allergies, current medications, past family history, past medical history, past social history, past surgical history and problem list.   Health Maintenance:  n/a   Review of Systems:  Pertinent items noted in HPI and remainder of comprehensive ROS otherwise negative.  Physical Exam:  Physical exam deferred due to nature of the encounter  Labs and Imaging No results found for this or any previous visit (from the past 336 hour(s)). No results found.     No orders of the defined types were placed in this encounter.   No orders of the defined types were placed in this encounter.   Assessment and Plan:       ICD-10-CM   1. Menorrhagia with regular cycle  N92.0   2. Intramural leiomyoma of uterus  D25.1   3. Iron deficiency anemia due to menstrua blood loss  D50.0    Pt has had an excellent response to the Myfembree and wants to  continue. She has some occasional hot flashes Bleeding is significantly less, 2 days of spotting with the last cycle      I discussed the assessment and treatment plan with the patient. The patient was provided an opportunity to ask questions and all were answered. The patient agreed with the plan and demonstrated an understanding of the instructions.   The patient was advised to call back or seek an in-person evaluation/go to the ED if the symptoms worsen or if the condition fails to improve as anticipated.  I provided 10 minutes of non-face-to-face time during this encounter.   Florian Buff, [place] for [facility]

## 2021-05-16 ENCOUNTER — Telehealth: Payer: Self-pay

## 2021-05-16 NOTE — Telephone Encounter (Signed)
Patient states she needs a copy of her childhood vaccinations for a new job. Our office does not have a  copy of her childhood immunizations. Patient advised to contact the central office where she went to school to get a copy of her transcript and the immunizations are listed on there. Patient verbalized understanding.

## 2021-05-16 NOTE — Telephone Encounter (Signed)
Pt needs copy of her shot record   Pt call back [phone]

## 2021-06-14 IMAGING — MR MR PELVIS WO/W CM
6 of 13 series · 20 of 48 positions shown · IV contrast (20ml Multihance)
Comparison: 11/19/2020 pelvic sonogram. 08/28/2018 CT
abdomen/pelvis.

CLINICAL DATA: 35-year-old female with indeterminate solid right
pelvic mass on recent ultrasound.

EXAM:
MRI PELVIS WITHOUT AND WITH CONTRAST
TECHNIQUE: Multiplanar multisequence MR imaging of the pelvis was performed
both before and after administration of intravenous contrast.
CONTRAST:  20mL MULTIHANCE GADOBENATE DIMEGLUMINE 529 MG/ML IV SOLN

[Series 3: T2 · coronal · 5.0mm · 0.78mm/px · 5 of 39 slices shown]
[im 1/39]
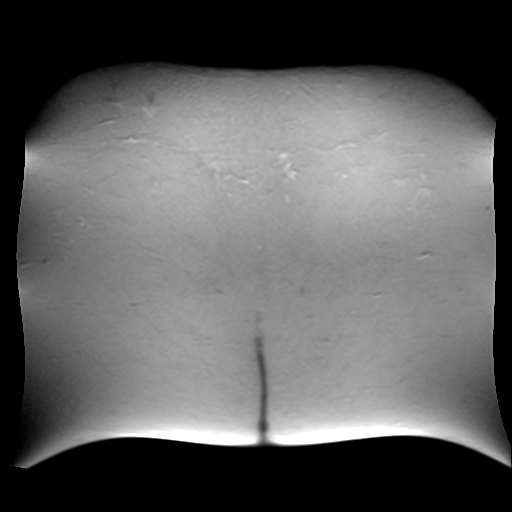
[im 10/39]
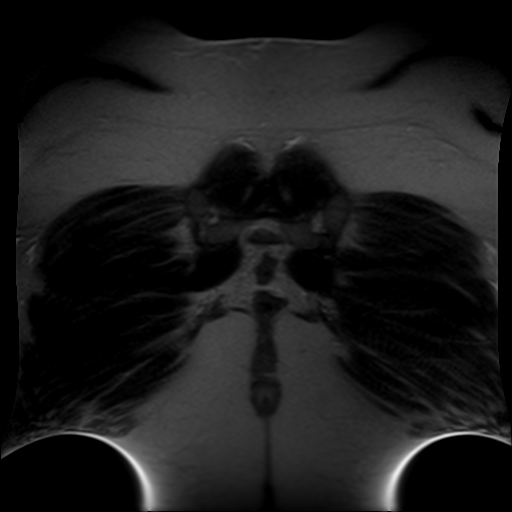
[im 20/39]
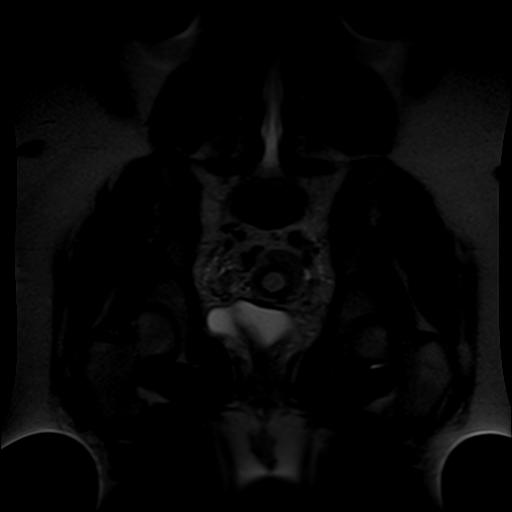
[im 29/39]
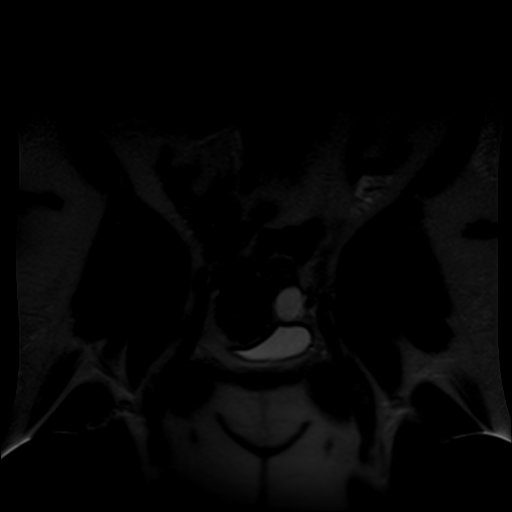
[im 39/39]
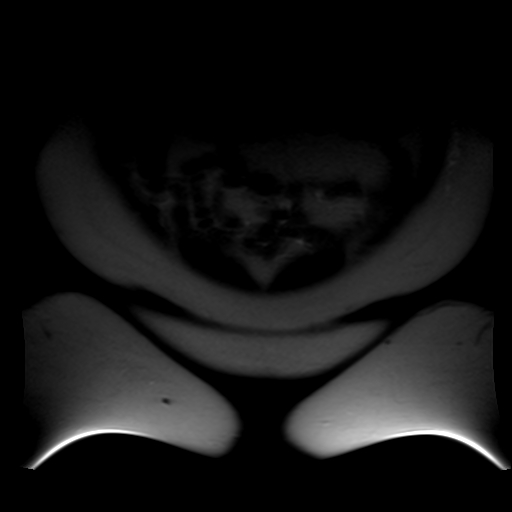

[Series 4: t2_tse_sag · sagittal · 5.0mm · 0.65mm/px · 4 of 33 slices shown]
[im 1/33]
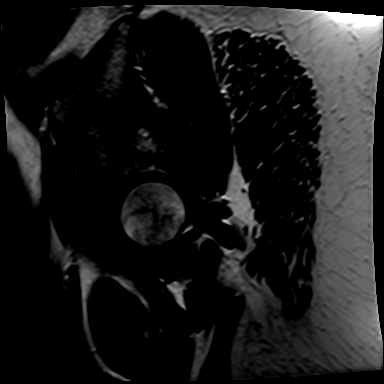
[im 11/33]
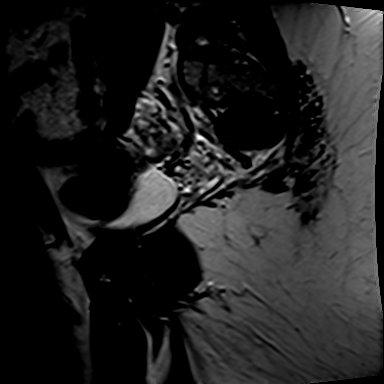
[im 22/33]
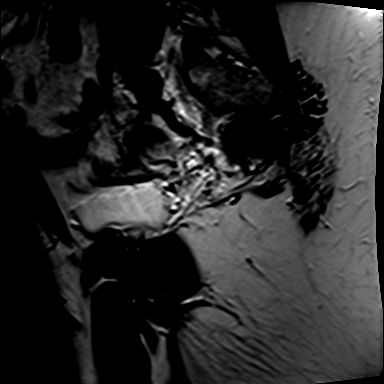
[im 33/33]
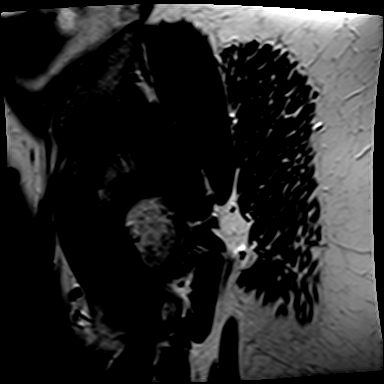

[Series 5: t2_tse axial · axial · 5.0mm · 0.51mm/px · z∈[-95,+111]mm · 4 of 34 slices shown]
[im 1/34]
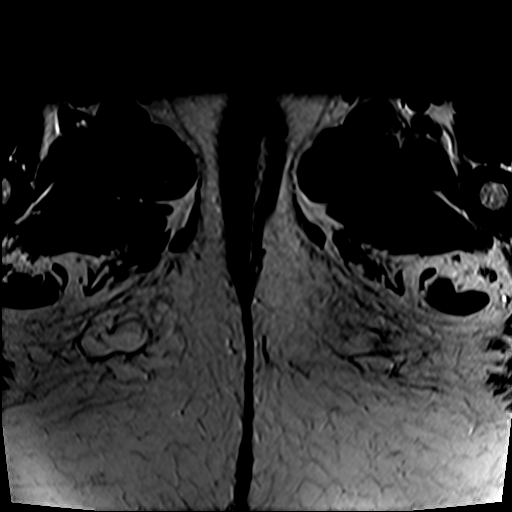
[im 12/34]
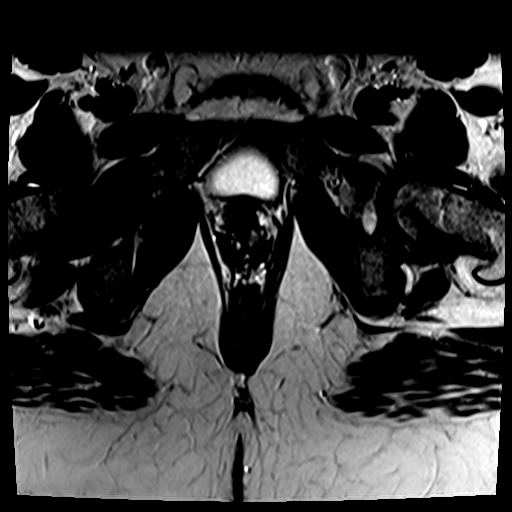
[im 23/34]
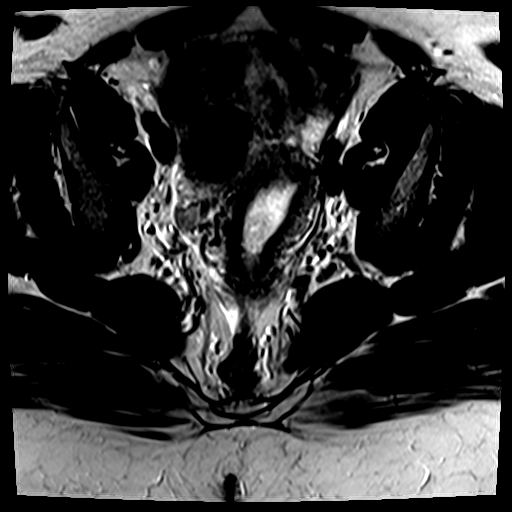
[im 34/34]
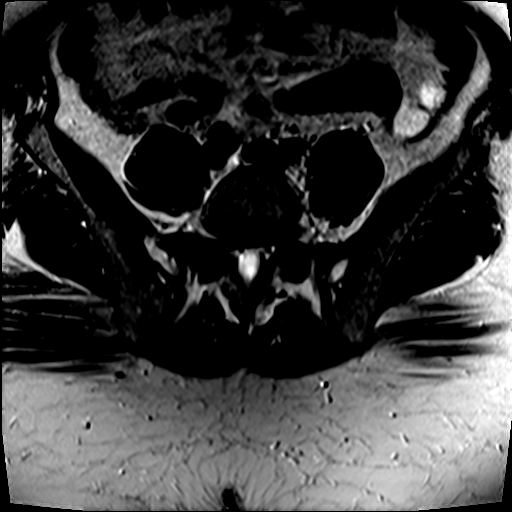

[Series 6: t2_tse axial fs · axial · 5.0mm · 0.51mm/px · z∈[-95,+111]mm · 3 of 34 slices shown]
[im 1/34]
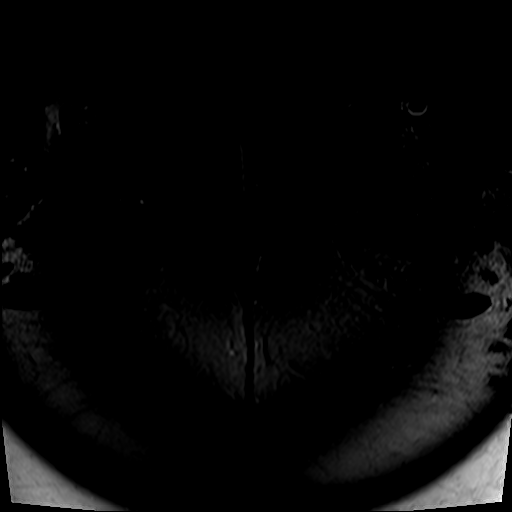
[im 17/34]
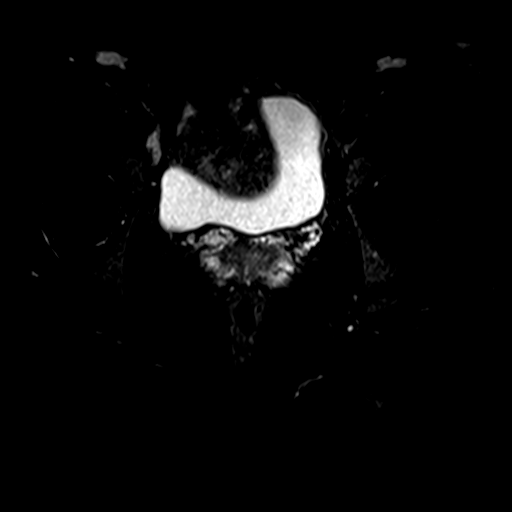
[im 34/34]
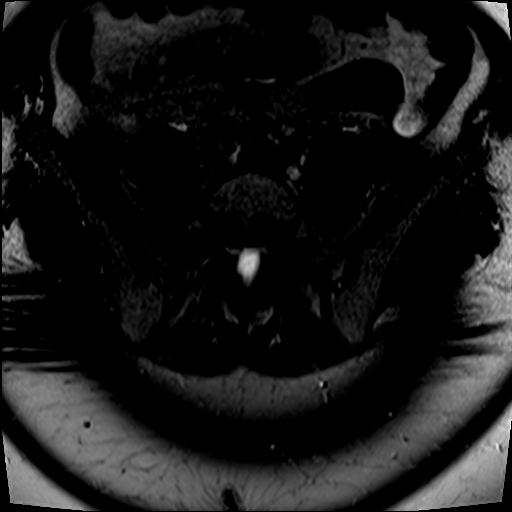

[Series 7: axial spgr · axial · 5.0mm · 0.51mm/px · z∈[-95,+111]mm · 3 of 34 slices shown]
[im 1/34]
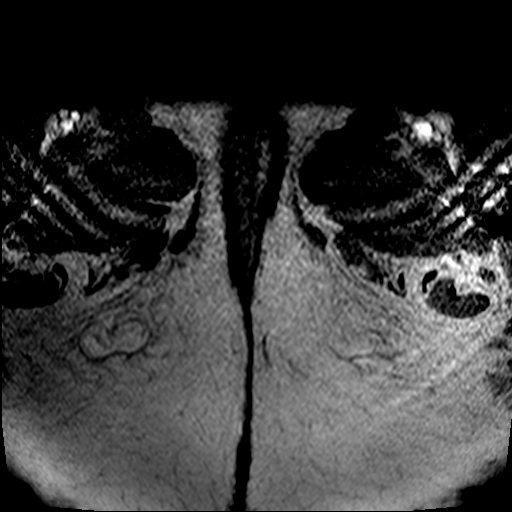
[im 17/34]
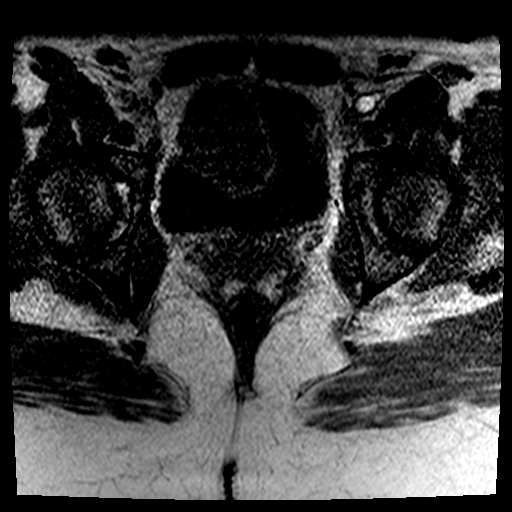
[im 34/34]
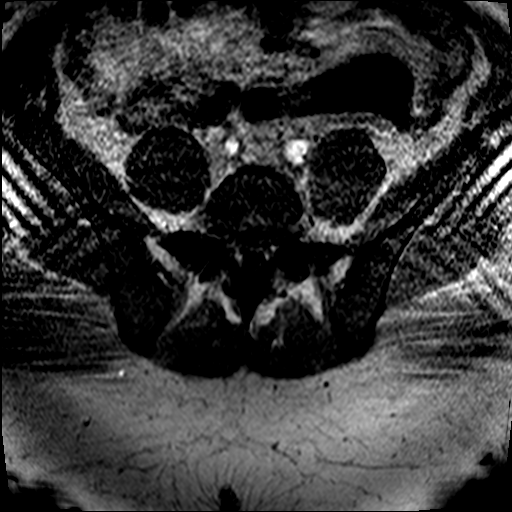

[Series 8: axial in out · axial · 5.5mm · 0.55mm/px · 1 of 68 slices shown]
[im 1/68]
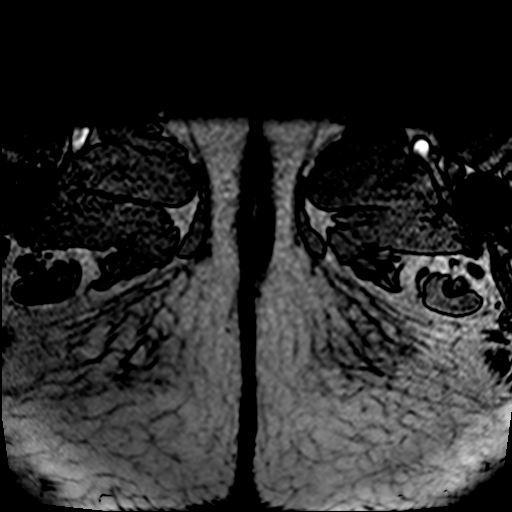

[20 of 48 positions shown; findings below may reference images not displayed]

FINDINGS: Urinary Tract:  Normal bladder.  Normal urethra.

Bowel: Visualized small and large bowel are normal caliber with no
bowel wall thickening.

Vascular/Lymphatic: No pathologically enlarged lymph nodes in the
pelvis. No acute vascular abnormality.

Reproductive:

Uterus: The anteverted myomatous uterus measures 8.9 x 3.7 x 5.4 cm.
Exophytic enhancing uterine fibroids as follows:

-right anterior uterine body exophytic 6.6 x 5.6 x 5.8 cm fibroid
with fairly broad uterine attachment (no discrete stalk identified)

-left fundal exophytic 3.3 x 2.6 x 2.5 cm fibroid

Inner myometrium (junctional zone) measures 7 mm in thickness, which
is within normal limits. Endometrium measures 15 mm in bilayer
thickness, which is top-normal. No endometrial cavity fluid. There
is a tiny 0.4 x 0.4 x 0.4 cm low T2 signal intensity filling defect
in the left uterine cavity (series 4/image 20 and series 5/image
11), which could represent a tiny submucosal fibroid. Normal uterine
cervix.

Ovaries and Adnexa: The right ovary measures 3.2 x 2.2 x 2.3 cm and
is normal. The left ovary measures 3.9 x 2.8 x 2.9 cm and contains a
simple 2.4 x 2.2 x 2.0 cm follicular cyst. There are no suspicious
ovarian or adnexal masses.

Other: No abnormal free fluid in the pelvis. No focal pelvic fluid
collection.

Musculoskeletal: No aggressive appearing focal osseous lesions.
IMPRESSION: 1. No suspicious ovarian or adnexal masses.
2. Myomatous uterus with dominant 6.6 cm exophytic right anterior
uterine body uterine fibroid. Possible tiny 0.4 x 0.4 x 0.4 cm
submucosal fibroid in the left uterine cavity. Suggest attention on
future follow-up pelvic ultrasound or sonohysterography studies.

## 2021-07-19 ENCOUNTER — Other Ambulatory Visit: Payer: Self-pay

## 2021-07-19 ENCOUNTER — Emergency Department (HOSPITAL_COMMUNITY): Payer: 59

## 2021-07-19 ENCOUNTER — Emergency Department (HOSPITAL_COMMUNITY)
Admission: EM | Admit: 2021-07-19 | Discharge: 2021-07-19 | Disposition: A | Discharge status: Home / Self Care | Payer: 59 | Attending: Emergency Medicine | Admitting: Emergency Medicine

## 2021-07-19 ENCOUNTER — Encounter (HOSPITAL_COMMUNITY): Payer: Self-pay | Admitting: Emergency Medicine

## 2021-07-19 DIAGNOSIS — I10 Essential (primary) hypertension: Secondary | ICD-10-CM

## 2021-07-19 DIAGNOSIS — Z041 Encounter for examination and observation following transport accident: Secondary | ICD-10-CM | POA: Diagnosis not present

## 2021-07-19 DIAGNOSIS — R509 Fever, unspecified: Secondary | ICD-10-CM | POA: Insufficient documentation

## 2021-07-19 DIAGNOSIS — M25512 Pain in left shoulder: Secondary | ICD-10-CM | POA: Insufficient documentation

## 2021-07-19 DIAGNOSIS — Y92410 Unspecified street and highway as the place of occurrence of the external cause: Secondary | ICD-10-CM | POA: Insufficient documentation

## 2021-07-19 DIAGNOSIS — Z79899 Other long term (current) drug therapy: Secondary | ICD-10-CM | POA: Diagnosis not present

## 2021-07-19 DIAGNOSIS — M79652 Pain in left thigh: Secondary | ICD-10-CM | POA: Diagnosis not present

## 2021-07-19 DIAGNOSIS — R109 Unspecified abdominal pain: Secondary | ICD-10-CM | POA: Insufficient documentation

## 2021-07-19 MED ORDER — KETOROLAC TROMETHAMINE 60 MG/2ML IM SOLN
60.0000 mg | Freq: Once | INTRAMUSCULAR | Status: AC
  Administered 2021-07-19: 60 mg via INTRAMUSCULAR
  Filled 2021-07-19: qty 2
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
## 2021-07-19 NOTE — ED Notes (Signed)
Pt ambulatory in ED lobby. 

## 2021-07-20 ENCOUNTER — Ambulatory Visit: Payer: BLUE CROSS/BLUE SHIELD | Admitting: Family Medicine

## 2021-07-20 ENCOUNTER — Encounter: Payer: Self-pay | Admitting: Family Medicine

## 2021-07-20 ENCOUNTER — Ambulatory Visit (INDEPENDENT_AMBULATORY_CARE_PROVIDER_SITE_OTHER): Payer: 59 | Admitting: Family Medicine

## 2021-07-20 ENCOUNTER — Other Ambulatory Visit (HOSPITAL_COMMUNITY): Payer: Self-pay

## 2021-07-20 DIAGNOSIS — M25512 Pain in left shoulder: Secondary | ICD-10-CM | POA: Diagnosis not present

## 2021-07-20 DIAGNOSIS — I10 Essential (primary) hypertension: Secondary | ICD-10-CM

## 2021-07-20 DIAGNOSIS — T07XXXA Unspecified multiple injuries, initial encounter: Secondary | ICD-10-CM

## 2021-07-20 MED ORDER — TRIAMTERENE-HCTZ 37.5-25 MG PO CAPS
1.0000 | ORAL_CAPSULE | Freq: Every day | ORAL | 1 refills | Status: DC
  Filled 2021-07-20: qty 90, 90d supply, fill #0

## 2021-07-20 MED ORDER — LISINOPRIL 10 MG PO TABS
10.0000 mg | ORAL_TABLET | Freq: Every day | ORAL | 1 refills | Status: DC
  Filled 2021-07-20: qty 90, 90d supply, fill #0
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
## 2021-08-11 ENCOUNTER — Other Ambulatory Visit (HOSPITAL_COMMUNITY): Payer: Self-pay

## 2021-08-15 ENCOUNTER — Ambulatory Visit: Payer: Self-pay | Admitting: Family Medicine

## 2021-08-15 ENCOUNTER — Other Ambulatory Visit: Payer: Self-pay

## 2021-08-15 VITALS — BP 138/86 | HR 77 | Ht 67.0 in | Wt 353.4 lb

## 2021-08-15 DIAGNOSIS — I10 Essential (primary) hypertension: Secondary | ICD-10-CM

## 2021-08-15 NOTE — Progress Notes (Signed)
Patient ID: Kimberly Kennedy, female    DOB: 11/21/1986, 35 y.o.   MRN: [id number]   Chief Complaint  Patient presents with   Hypertension   Subjective:    HPI HTN Pt compliant with BP meds.  No SEs Denies chest pain, sob, LE swelling, or blurry vision.   Took 1 bp med last night. -lisionpril Taking triamterene/hctz in am, didn't take it yet.  Had lisinopril '10mg'$  lisinopril at night. Seeing home 120s bp.  Last visit in 8/22- seeing 167/110. No chest pain, sob, headaches.  Mild lower leg swelling when on period.  Occ having some dizziness when standing quickly.  But resolves.   Medical History Kimberly Kennedy has no past medical history on file.   Outpatient Encounter Medications as of 08/15/2021  Medication Sig   ibuprofen (ADVIL) 200 MG tablet Take 200 mg by mouth 2 (two) times daily.   lisinopril (ZESTRIL) 10 MG tablet Take 1 tablet (10 mg total) by mouth daily.   Polyethylene Glycol 400 0.25 % SOLN Apply 1 drop to eye daily as needed. For dry eye relief   Relugolix-Estradiol-Norethind (MYFEMBREE) 40-1-0.5 MG TABS Take 1 tablet by mouth daily.   triamterene-hydrochlorothiazide (DYAZIDE) 37.5-25 MG capsule Take 1 capsule by mouth once daily.   No facility-administered encounter medications on file as of 08/15/2021.     Review of Systems  Constitutional:  Negative for chills and fever.  HENT:  Negative for congestion, rhinorrhea and sore throat.   Respiratory:  Negative for cough, shortness of breath and wheezing.   Cardiovascular:  Negative for chest pain and leg swelling.  Gastrointestinal:  Negative for abdominal pain, diarrhea, nausea and vomiting.  Genitourinary:  Negative for dysuria and frequency.  Musculoskeletal:  Negative for arthralgias and back pain.  Skin:  Negative for rash.  Neurological:  Positive for dizziness (occ with standing). Negative for weakness and headaches.    Vitals BP (!) 148/96 Comment: has not taken BP med yet  Pulse 77   Ht '5\' 7"'$   (1.702 m)   Wt (!) 353 lb 6.4 oz (160.3 kg)   BMI 55.35 kg/m   Objective:   Physical Exam Vitals and nursing note reviewed.  Constitutional:      General: She is not in acute distress.    Appearance: Normal appearance. She is obese. She is not ill-appearing.  HENT:     Head: Normocephalic and atraumatic.  Cardiovascular:     Rate and Rhythm: Normal rate and regular rhythm.     Pulses: Normal pulses.     Heart sounds: Normal heart sounds.  Pulmonary:     Effort: Pulmonary effort is normal.     Breath sounds: Normal breath sounds. No wheezing, rhonchi or rales.  Musculoskeletal:        General: Normal range of motion.     Right lower leg: No edema.     Left lower leg: No edema.  Skin:    General: Skin is warm and dry.     Findings: No lesion or rash.  Neurological:     General: No focal deficit present.     Mental Status: She is alert and oriented to person, place, and time.     Cranial Nerves: No cranial nerve deficit.  Psychiatric:        Mood and Affect: Mood normal.        Behavior: Behavior normal.        Thought Content: Thought content normal.        Judgment: Judgment  normal.     Assessment and Plan   1. Chronic hypertension   Htn- uncontrolled.  However, has improved compared to last visit at 167/110. Pt didn't take bp med this am.  Pt to take meds when getting home today.  Check bp 2x per day am and pm and call in 4 days with results.  If seeing 145/85 and over to call sooner. Recommending dash diet and cont meds.  Pt havign some dizziness with standing with the meds.  So we weill have her check bp at home.  Pt stating bp at home more in 120s.  Pt in agreement.   Return in about 3 months (around 11/14/2021) for recheck bp.

## 2021-08-15 NOTE — Patient Instructions (Signed)
Take both bp meds daily.   Check bp 2x per day, record,  Call on Thursday with numbers.  If seeing numbers 145/85 and over 3x then call office sooner.

## 2021-08-30 ENCOUNTER — Ambulatory Visit
Admission: EM | Admit: 2021-08-30 | Discharge: 2021-08-30 | Disposition: A | Discharge status: Home / Self Care | Payer: 59 | Attending: Physician Assistant | Admitting: Physician Assistant

## 2021-08-30 ENCOUNTER — Encounter: Payer: Self-pay | Admitting: Emergency Medicine

## 2021-08-30 ENCOUNTER — Other Ambulatory Visit: Payer: Self-pay

## 2021-08-30 DIAGNOSIS — Z20822 Contact with and (suspected) exposure to covid-19: Secondary | ICD-10-CM | POA: Diagnosis not present

## 2021-08-30 HISTORY — DX: Essential (primary) hypertension: I10

## 2021-08-30 NOTE — Discharge Instructions (Addendum)
Try OTC cough medication.  Test are pending.  Your lisinopril may be causing cough

## 2021-08-30 NOTE — ED Provider Notes (Signed)
[facility]    CSN: [id number] Arrival date & time: 08/30/21  1533      History   Chief Complaint No chief complaint on file.   HPI Kimberly Kennedy is a 35 y.o. female.   The history is provided by the patient. No language interpreter was used.  Cough Cough characteristics:  Non-productive Sputum characteristics:  Nondescript Severity:  Moderate Onset quality:  Gradual Duration:  3 weeks Timing:  Constant Chronicity:  New Smoker: no   Context: upper respiratory infection   Relieved by:  Nothing Ineffective treatments:  None tried Risk factors: no recent infection   Pt has had a dry cough since starting lisinopril.  Pt reports ome drainage now.  No fever Past Medical History:  Diagnosis Date   Hypertension     Patient Active Problem List   Diagnosis Date Noted   Chronic hypertension 12/08/2020   Adnexal mass 12/08/2020   History of uterine fibroid 07/27/2020   Anemia 07/27/2020   Lower leg edema 07/27/2020   Class 3 severe obesity due to excess calories with body mass index (BMI) of 50.0 to 59.9 in adult [facility]) 07/27/2020    History reviewed. No pertinent surgical history.  OB History     Gravida  0   Para  0   Term  0   Preterm  0   AB  0   Living  0      SAB  0   IAB  0   Ectopic  0   Multiple  0   Live Births  0            Home Medications    Prior to Admission medications   Medication Sig Start Date End Date Taking? Authorizing Provider  ibuprofen (ADVIL) 200 MG tablet Take 200 mg by mouth 2 (two) times daily.    [provider]  lisinopril (ZESTRIL) 10 MG tablet Take 1 tablet (10 mg total) by mouth daily. 07/20/21   Elvia Collum M, DO  Polyethylene Glycol 400 0.25 % SOLN Apply 1 drop to eye daily as needed. For dry eye relief    [provider]  Relugolix-Estradiol-Norethind (MYFEMBREE) 40-1-0.5 MG TABS Take 1 tablet by mouth daily. 12/27/20   Florian Buff, MD  triamterene-hydrochlorothiazide  (DYAZIDE) 37.5-25 MG capsule Take 1 capsule by mouth once daily. 07/20/21   Erven Colla, DO    Family History Family History  Problem Relation Age of Onset   Diabetes Mother    Hypertension Mother    Hypothyroidism Mother    Breast cancer Mother    Hypertension Sister    Rheum arthritis Sister     Social History Social History   Tobacco Use   Smoking status: Never   Smokeless tobacco: Never  Vaping Use   Vaping Use: Never used  Substance Use Topics   Alcohol use: Yes    Comment: occ.   Drug use: No     Allergies   Bactrim [sulfamethoxazole-trimethoprim]   Review of Systems Review of Systems  Respiratory:  Positive for cough.   All other systems reviewed and are negative.   Physical Exam Triage Vital Signs ED Triage Vitals  Enc Vitals Group     BP 08/30/21 1801 (!) 144/92     Pulse Rate 08/30/21 1801 74     Resp 08/30/21 1801 18     Temp 08/30/21 1801 98.2 F (36.8 C)     Temp Source 08/30/21 1801 Oral  SpO2 08/30/21 1801 97 %     Weight --      Height --      Head Circumference --      Peak Flow --      Pain Score 08/30/21 1803 4     Pain Loc --      Pain Edu? --      Excl. in [place]? --    No data found.  Updated Vital Signs BP (!) 144/92 (BP Location: Right Arm)   Pulse 74   Temp 98.2 F (36.8 C) (Oral)   Resp 18   LMP 08/19/2021 (Exact Date)   SpO2 97%   Visual Acuity Right Eye Distance:   Left Eye Distance:   Bilateral Distance:    Right Eye Near:   Left Eye Near:    Bilateral Near:     Physical Exam Vitals and nursing note reviewed.  Constitutional:      Appearance: She is well-developed.  HENT:     Head: Normocephalic.  Cardiovascular:     Rate and Rhythm: Normal rate.  Pulmonary:     Effort: Pulmonary effort is normal.  Abdominal:     General: There is no distension.  Musculoskeletal:        General: Normal range of motion.     Cervical back: Normal range of motion.  Skin:    General: Skin is warm.   Neurological:     General: No focal deficit present.     Mental Status: She is alert and oriented to person, place, and time.     UC Treatments / Results  Labs (all labs ordered are listed, but only abnormal results are displayed) Labs Reviewed  NOVEL CORONAVIRUS, NAA    EKG   Radiology No results found.  Procedures Procedures (including critical care time)  Medications Ordered in UC Medications - No data to display  Initial Impression / Assessment and Plan / UC Course  I have reviewed the triage vital signs and the nursing notes.  Pertinent labs & imaging results that were available during my care of the patient were reviewed by me and considered in my medical decision making (see chart for details).     MDM: covid and influenza pending Final Clinical Impressions(s) / UC Diagnoses   Final diagnoses:  Exposure to COVID-19 virus   Discharge Instructions   None    ED Prescriptions   None    PDMP not reviewed this encounter. An After Visit Summary was printed and given to the patient.    Fransico Meadow, [place] 08/30/21 1840

## 2021-08-30 NOTE — ED Triage Notes (Signed)
Cough since taking new BP medication (lisinopril).  States cough is keeping  her up at night.  Productive cough. States throat is a little sore.

## 2021-08-31 LAB — NOVEL CORONAVIRUS, NAA: SARS-CoV-2, NAA: NOT DETECTED

## 2021-08-31 LAB — SARS-COV-2, NAA 2 DAY TAT

## 2021-09-01 ENCOUNTER — Telehealth: Payer: Self-pay | Admitting: Nurse Practitioner

## 2021-09-01 NOTE — Telephone Encounter (Signed)
Pt called in and states that Lisinopril has began to make her cough. She started the med in August and the past week she has developed a cough. Coughing so much it keeps her up at night. Pt is wanting to know if med can be changed or what can she do about the cough. Please advise. Thank you  [place]  CB# [phone]

## 2021-09-02 NOTE — Telephone Encounter (Signed)
Pt had congestion and went to Urgent Care but COVID test was negative. Pt unsure if it could be allergies because she is prone to allergies. States she began coughing early part of September. Did not take Lisinopril the past 2 evenings and has not been coughing as much. Please advise.  Pt gives verbal consent to speak with mom is pt is not home. Mom-Marsha [facility]

## 2021-09-09 ENCOUNTER — Other Ambulatory Visit: Payer: Self-pay | Admitting: Nurse Practitioner

## 2021-09-09 MED ORDER — VALSARTAN 40 MG PO TABS: 40.0000 mg | ORAL_TABLET | Freq: Every day | ORAL | 0 refills | Status: DC

## 2021-09-09 NOTE — Telephone Encounter (Signed)
Patient called in inquiring why she hasn't heard anything back. I advised patient that we had just got a response this morning. She is aware that the new medication has been called in for her. She states she has been taken her b/p at home and says reading have been 130/83 without taken any medication. She did state that when she went to Urgent Care last week it was 138/99 when she was taken medication.   CB# [phone]

## 2021-09-09 NOTE — Telephone Encounter (Signed)
Please advise. Thank you

## 2021-09-09 NOTE — Telephone Encounter (Signed)
Left message to return call 

## 2021-09-09 NOTE — Telephone Encounter (Signed)
Pt returned call and verbalized understanding. Pt has scheduled appointment to establish care with Dr.Cook.

## 2021-10-11 ENCOUNTER — Ambulatory Visit: Payer: 59 | Admitting: Family Medicine

## 2021-10-31 ENCOUNTER — Ambulatory Visit: Payer: 59 | Admitting: Family Medicine

## 2021-12-07 ENCOUNTER — Encounter: Payer: Self-pay | Admitting: Nurse Practitioner

## 2021-12-07 ENCOUNTER — Other Ambulatory Visit: Payer: Self-pay

## 2021-12-07 ENCOUNTER — Ambulatory Visit: Payer: 59 | Admitting: Nurse Practitioner

## 2021-12-07 DIAGNOSIS — U071 COVID-19: Secondary | ICD-10-CM | POA: Diagnosis not present

## 2021-12-07 NOTE — Progress Notes (Signed)
° °  Subjective:    Patient ID: Kimberly Kennedy, female    DOB: 1986/03/01, 36 y.o.   MRN: [id number]  HPI   Virtual Visit via telephone Note  I connected with Kimberly Kennedy on 12/07/21 at 1:00pm by telephone and verified that I am speaking with the correct person using two identifiers. NINAH MOCCIO is currently located at her home.The provider, Trenton Gammon Rebeka Kimble, NP is located in their office at time of visit.  Call ended at 1:10pm  I discussed the limitations, risks, security and privacy concerns of performing an evaluation and management service by telephone and the availability of in person appointments. I also discussed with the patient that there may be a patient responsible charge related to this service. The patient expressed understanding and agreed to proceed.   History and Present Illness:  Patient started feeling upper respiratory symptoms (nasal congestion and non productive cough) yesterday and she tested for COVID yesterday evening while at work. COVID test was positive at that time. Patient was sent home from work.   Patient currently admits to nasal congestion, nasal and sinus pressure, and non productive cough. Denies SOB, wheezing, difficulty breathing, or chest tightness. Patient using vicks vapor rub, mucinex, vitamin C, zinc, nasal sprays for relief.    Observations/Objective: Patient able to speak without being SOB. Does not seem to be in distress or having difficulty breathing.   Assessment and Plan:  [place] is detected Under current CDC guidelines it is recommended to stay self isolated for at least 5 days.  If feeling well after 5 days may return to normal activities as long as you wears a mask for 5 days.  If you are not feeling well after 5 days you should stay under self-isolation for 10 days.  Warning signs to watch for if you develops chest tightness shortness of breath severe pain change in mental status you should seek further evaluation  in the ER.   - Patient lives with sister and mom. Wear masks in the house and practice good hand hygiene and wipe down surfaces.  - May continue to use Mucinex, vitamin D, zinc, saline nasal sprays.    Follow up plan:  Follow up as needed.      I discussed the assessment and treatment plan with the patient. The patient was provided an opportunity to ask questions and all were answered. The patient agreed with the plan and demonstrated an understanding of the instructions.   The patient was advised to call back or seek an in-person evaluation if the symptoms worsen or if the condition fails to improve as anticipated.  The above assessment and management plan was discussed with the patient. The patient verbalized understanding of and has agreed to the management plan. Patient is aware to call the clinic if symptoms persist or worsen. Patient is aware when to return to the clinic for a follow-up visit. Patient educated on when it is appropriate to go to the emergency department.    I provided 10 minutes of non-face-to-face time during this encounter.    Claire Shown, NP

## 2022-01-09 ENCOUNTER — Other Ambulatory Visit: Payer: Self-pay

## 2022-01-10 MED ORDER — MYFEMBREE 40-1-0.5 MG PO TABS: 1.0000 | ORAL_TABLET | Freq: Every day | ORAL | 11 refills | Status: DC

## 2022-01-11 NOTE — Telephone Encounter (Signed)
Called pt to inform her of prescription refill approval, no answer, left vm

## 2022-02-01 ENCOUNTER — Other Ambulatory Visit: Payer: Self-pay

## 2022-07-12 ENCOUNTER — Ambulatory Visit (INDEPENDENT_AMBULATORY_CARE_PROVIDER_SITE_OTHER): Payer: 59 | Admitting: Nurse Practitioner

## 2022-07-12 ENCOUNTER — Encounter: Payer: Self-pay | Admitting: Nurse Practitioner

## 2022-07-12 VITALS — BP 124/84 | HR 80 | Ht 67.0 in | Wt 356.2 lb

## 2022-07-12 DIAGNOSIS — R600 Localized edema: Secondary | ICD-10-CM | POA: Diagnosis not present

## 2022-07-12 DIAGNOSIS — Z124 Encounter for screening for malignant neoplasm of cervix: Secondary | ICD-10-CM | POA: Diagnosis not present

## 2022-07-12 DIAGNOSIS — Z01419 Encounter for gynecological examination (general) (routine) without abnormal findings: Secondary | ICD-10-CM | POA: Diagnosis not present

## 2022-07-12 DIAGNOSIS — N920 Excessive and frequent menstruation with regular cycle: Secondary | ICD-10-CM | POA: Diagnosis not present

## 2022-07-12 DIAGNOSIS — Z6841 Body Mass Index (BMI) 40.0 and over, adult: Secondary | ICD-10-CM

## 2022-07-12 MED ORDER — TRIAMTERENE-HCTZ 37.5-25 MG PO CAPS: 1.0000 | ORAL_CAPSULE | Freq: Every day | ORAL | 1 refills | Status: DC

## 2022-07-12 NOTE — Progress Notes (Addendum)
Subjective:    Patient ID: Kimberly Kennedy, female    DOB: 1986/09/14, 36 y.o.   MRN: [id number]  HPI  The patient comes in today for a wellness visit.    A review of their health history was completed.  A review of medications was also completed.  Any needed refills; Blood pressure medicine  Eating habits: ok  Falls/  MVA accidents in past few months: none  Regular exercise: some  Specialist pt sees on regular basis: none  Preventative health issues were discussed.   Additional concerns: none   Review of Systems  All other systems reviewed and are negative.      Objective:   Physical Exam Vitals reviewed. Exam conducted with a chaperone present.  Constitutional:      General: She is not in acute distress.    Appearance: Normal appearance. She is obese. She is not ill-appearing, toxic-appearing or diaphoretic.  HENT:     Head: Normocephalic and atraumatic.  Cardiovascular:     Rate and Rhythm: Normal rate and regular rhythm.     Pulses: Normal pulses.     Heart sounds: Normal heart sounds. No murmur heard. Pulmonary:     Effort: Pulmonary effort is normal. No respiratory distress.     Breath sounds: Normal breath sounds. No wheezing.  Chest:     Chest wall: No mass, lacerations, deformity, swelling, tenderness, crepitus or edema. There is no dullness to percussion.  Breasts:    Breasts are symmetrical.     Right: Normal. No swelling, bleeding, inverted nipple, mass, nipple discharge, skin change or tenderness.     Left: Normal. No swelling, bleeding, inverted nipple, mass, nipple discharge, skin change or tenderness.     Comments: Dense breast tissue Abdominal:     Hernia: There is no hernia in the left inguinal area or right inguinal area.  Genitourinary:    General: Normal vulva.     Exam position: Lithotomy position.     Pubic Area: No rash or pubic lice.      Labia:        Right: No rash, tenderness, lesion or injury.        Left: No rash,  tenderness, lesion or injury.      Urethra: No prolapse, urethral pain, urethral swelling or urethral lesion.     Vagina: Normal. No signs of injury and foreign body. No vaginal discharge, erythema, tenderness, bleeding, lesions or prolapsed vaginal walls.     Cervix: No cervical motion tenderness, discharge, friability, lesion, erythema, cervical bleeding or eversion.     Comments: Unable to assess uterus or adnexa due to habitus.  Rectum grossly intact Musculoskeletal:     Right lower leg: Edema present.     Left lower leg: Edema present.     Comments: Grossly intact. Nonpitting edema noted to bilateral legs.   Lymphadenopathy:     Upper Body:     Right upper body: No supraclavicular, axillary or pectoral adenopathy.     Left upper body: No supraclavicular, axillary or pectoral adenopathy.     Lower Body: No right inguinal adenopathy. No left inguinal adenopathy.  Skin:    General: Skin is warm.     Capillary Refill: Capillary refill takes less than 2 seconds.  Neurological:     Mental Status: She is alert.     Comments: Grossly intact  Psychiatric:        Mood and Affect: Mood normal.        Behavior: Behavior normal.  Assessment & Plan:   1. Well woman exam with routine gynecological exam Adult wellness-complete.wellness physical was conducted today. Importance of diet and exercise were discussed in detail.  Importance of stress reduction and healthy living were discussed.  In addition to this a discussion regarding safety was also covered.  We also reviewed over immunizations and gave recommendations regarding current immunization needed for age.   In addition to this additional areas were also touched on including: Preventative health exams needed:  Colonoscopy not indicated Tetanus: Encouraged patient to go to pharmacy to have vaccination placed PAP completed today  Patient never sexually active, no need for Hep C or HIV screening at this time.    Patient was advised yearly wellness exam   2. Cervical cancer screening - IGP, rfx Aptima HPV ASCU  3. Class 3 severe obesity due to excess calories with body mass index (BMI) of 50.0 to 59.9 in adult, unspecified whether serious comorbidity present (HCC) - CMP14+EGFR - Lipid Profile - HgB A1c  4. Menorrhagia with regular cycle - CBC with Differential  5. Lower leg edema -Refill - triamterene-hydrochlorothiazide (DYAZIDE) 37.5-25 MG capsule; Take 1 capsule by mouth once daily.  Dispense: 90 capsule; Refill: 1

## 2022-07-13 LAB — CMP14+EGFR
ALT: 12 IU/L (ref 0–32)
AST: 12 IU/L (ref 0–40)
Albumin/Globulin Ratio: 1.4 (ref 1.2–2.2)
Albumin: 4.4 g/dL (ref 3.9–4.9)
Alkaline Phosphatase: 63 IU/L (ref 44–121)
BUN/Creatinine Ratio: 14 (ref 9–23)
BUN: 12 mg/dL (ref 6–20)
Bilirubin Total: <0.2 mg/dL (ref 0.0–1.2)
CO2: 21 mmol/L (ref 20–29)
Calcium: 9.5 mg/dL (ref 8.7–10.2)
Chloride: 100 mmol/L (ref 96–106)
Creatinine, Ser: 0.84 mg/dL (ref 0.57–1.00)
Globulin, Total: 3.2 g/dL (ref 1.5–4.5)
Glucose: 85 mg/dL (ref 70–99)
Potassium: 4.7 mmol/L (ref 3.5–5.2)
Sodium: 139 mmol/L (ref 134–144)
Total Protein: 7.6 g/dL (ref 6.0–8.5)
eGFR: 91 mL/min/{1.73_m2} (ref >59)

## 2022-07-13 LAB — CBC WITH DIFFERENTIAL/PLATELET
Basophils Absolute: 0 10*3/uL (ref 0.0–0.2)
Basos: 0 %
EOS (ABSOLUTE): 0.1 10*3/uL (ref 0.0–0.4)
Eos: 2 %
Hematocrit: 30.6 % — ABNORMAL LOW (ref 34.0–46.6)
Hemoglobin: 9.2 g/dL — ABNORMAL LOW (ref 11.1–15.9)
Immature Grans (Abs): 0 10*3/uL (ref 0.0–0.1)
Immature Granulocytes: 0 %
Lymphocytes Absolute: 2.8 10*3/uL (ref 0.7–3.1)
Lymphs: 35 %
MCH: 20.9 pg — ABNORMAL LOW (ref 26.6–33.0)
MCHC: 30.1 g/dL — ABNORMAL LOW (ref 31.5–35.7)
MCV: 70 fL — ABNORMAL LOW (ref 79–97)
Monocytes Absolute: 0.5 10*3/uL (ref 0.1–0.9)
Monocytes: 7 %
Neutrophils Absolute: 4.4 10*3/uL (ref 1.4–7.0)
Neutrophils: 56 %
Platelets: 289 10*3/uL (ref 150–450)
RBC: 4.4 x10E6/uL (ref 3.77–5.28)
RDW: 17.4 % — ABNORMAL HIGH (ref 11.7–15.4)
WBC: 7.9 10*3/uL (ref 3.4–10.8)

## 2022-07-13 LAB — HEMOGLOBIN A1C
Est. average glucose Bld gHb Est-mCnc: 131 mg/dL
Hgb A1c MFr Bld: 6.2 % — ABNORMAL HIGH (ref 4.8–5.6)

## 2022-07-13 LAB — LIPID PANEL
Chol/HDL Ratio: 3.1 ratio (ref 0.0–4.4)
Cholesterol, Total: 138 mg/dL (ref 100–199)
HDL: 45 mg/dL (ref >39)
LDL Chol Calc (NIH): 75 mg/dL (ref 0–99)
Triglycerides: 93 mg/dL (ref 0–149)
VLDL Cholesterol Cal: 18 mg/dL (ref 5–40)

## 2022-07-14 DIAGNOSIS — Z124 Encounter for screening for malignant neoplasm of cervix: Secondary | ICD-10-CM | POA: Diagnosis not present

## 2022-07-20 ENCOUNTER — Other Ambulatory Visit: Payer: Self-pay | Admitting: Nurse Practitioner

## 2022-07-20 MED ORDER — IRON (FERROUS SULFATE) 325 (65 FE) MG PO TABS: 325.0000 mg | ORAL_TABLET | Freq: Every day | ORAL | 2 refills | Status: AC

## 2022-07-22 LAB — IGP, RFX APTIMA HPV ASCU

## 2022-07-22 LAB — GYN REPORT

## 2022-07-22 LAB — SPECIMEN STATUS REPORT

## 2022-09-15 ENCOUNTER — Ambulatory Visit
Admission: EM | Admit: 2022-09-15 | Discharge: 2022-09-15 | Disposition: A | Discharge status: Home / Self Care | Payer: 59 | Attending: Nurse Practitioner | Admitting: Nurse Practitioner

## 2022-09-15 DIAGNOSIS — L02411 Cutaneous abscess of right axilla: Secondary | ICD-10-CM

## 2022-09-15 DIAGNOSIS — M7918 Myalgia, other site: Secondary | ICD-10-CM

## 2022-09-15 MED ORDER — IBUPROFEN 800 MG PO TABS: 800.0000 mg | ORAL_TABLET | Freq: Three times a day (TID) | ORAL | 0 refills | Status: DC | PRN

## 2022-09-15 MED ORDER — DOXYCYCLINE HYCLATE 100 MG PO TABS: 100.0000 mg | ORAL_TABLET | Freq: Two times a day (BID) | ORAL | 0 refills | Status: DC

## 2022-09-15 MED ORDER — METHOCARBAMOL 500 MG PO TABS: 500.0000 mg | ORAL_TABLET | Freq: Two times a day (BID) | ORAL | 0 refills | Status: DC

## 2022-09-15 NOTE — ED Provider Notes (Signed)
[facility]    CSN: [id number] Arrival date & time: 09/15/22  1519      History   Chief Complaint Chief Complaint  Patient presents with   Abscess    HPI Kimberly Kennedy is a 36 y.o. female.   The history is provided by the patient.   Patient presents for complaints of abscess to the right underarm and right-sided thoracic pain.  Patient states the abscess has been present for at least 2 weeks.  She states over the last 2 weeks, the area did drain, but she still has pain and drainage from the site.  She states that she has a history of recurrent abscess, but has not had one in quite some time.  She states that when the area initially drained there was a brown substance that was returned from the site.  She denies fever, chills, chest pain, nausea, vomiting, or diarrhea.  She also complains of right-sided mid back pain that is been present for the past 2 weeks.  She states this is a chronic condition, but states in the past 2 days symptoms have worsened.  She states she was at work over the last 2 days and she was reaching for something and feels like she "pulled a muscle.  She states pain worsens with moving, twisting, and turning.  She denies numbness, tingling, radiation of pain, neck pain, or shoulder pain.  Past Medical History:  Diagnosis Date   Hypertension     Patient Active Problem List   Diagnosis Date Noted   Chronic hypertension 12/08/2020   Adnexal mass 12/08/2020   History of uterine fibroid 07/27/2020   Anemia 07/27/2020   Lower leg edema 07/27/2020   Class 3 severe obesity due to excess calories with body mass index (BMI) of 50.0 to 59.9 in adult [facility]) 07/27/2020    History reviewed. No pertinent surgical history.  OB History     Gravida  0   Para  0   Term  0   Preterm  0   AB  0   Living  0      SAB  0   IAB  0   Ectopic  0   Multiple  0   Live Births  0            Home Medications    Prior to Admission  medications   Medication Sig Start Date End Date Taking? Authorizing Provider  doxycycline (VIBRA-TABS) 100 MG tablet Take 1 tablet (100 mg total) by mouth 2 (two) times daily. 09/15/22  Yes Zanyah Lentsch-Warren, Alda Lea, NP  ibuprofen (ADVIL) 800 MG tablet Take 1 tablet (800 mg total) by mouth every 8 (eight) hours as needed. 09/15/22  Yes Shuaib Corsino-Warren, Alda Lea, NP  methocarbamol (ROBAXIN) 500 MG tablet Take 1 tablet (500 mg total) by mouth 2 (two) times daily. 09/15/22  Yes Heru Montz-Warren, Alda Lea, NP  Iron, Ferrous Sulfate, 325 (65 Fe) MG TABS Take 325 mg by mouth daily. 07/20/22   [employer]med[employer], Trenton Gammon, NP  Polyethylene Glycol 400 0.25 % SOLN Apply 1 drop to eye daily as needed. For dry eye relief    [provider]  Relugolix-Estradiol-Norethind (MYFEMBREE) 40-1-0.5 MG TABS Take 1 tablet by mouth daily. 01/10/22   Janyth Pupa, DO  triamterene-hydrochlorothiazide (DYAZIDE) 37.5-25 MG capsule Take 1 capsule by mouth once daily. 07/12/22   [employer]med[employer], Trenton Gammon, NP  valsartan (DIOVAN) 40 MG tablet Take 1 tablet (40 mg total) by mouth daily. 09/09/21   Hoskins,  Katharine Look, NP    Family History Family History  Problem Relation Age of Onset   Diabetes Mother    Hypertension Mother    Hypothyroidism Mother    Breast cancer Mother    Hypertension Sister    Rheum arthritis Sister     Social History Social History   Tobacco Use   Smoking status: Never   Smokeless tobacco: Never  Vaping Use   Vaping Use: Never used  Substance Use Topics   Alcohol use: Yes    Comment: occ.   Drug use: No     Allergies   Bactrim [sulfamethoxazole-trimethoprim] and Lisinopril   Review of Systems Review of Systems Per HPI  Physical Exam Triage Vital Signs ED Triage Vitals  Enc Vitals Group     BP 09/15/22 1536 (!) 173/129     Pulse Rate 09/15/22 1536 69     Resp 09/15/22 1536 18     Temp 09/15/22 1536 98.8 F (37.1 C)     Temp Source 09/15/22 1536 Oral     SpO2 09/15/22 1536 98 %      Weight --      Height --      Head Circumference --      Peak Flow --      Pain Score 09/15/22 1538 8     Pain Loc --      Pain Edu? --      Excl. in [place]? --    No data found.  Updated Vital Signs BP (!) 173/129 (BP Location: Right Wrist) Comment: Pt has not taken her meds today  Pulse 69   Temp 98.8 F (37.1 C) (Oral)   Resp 18   SpO2 98%   Visual Acuity Right Eye Distance:   Left Eye Distance:   Bilateral Distance:    Right Eye Near:   Left Eye Near:    Bilateral Near:     Physical Exam Vitals and nursing note reviewed.  Constitutional:      General: She is not in acute distress.    Appearance: Normal appearance.  HENT:     Head: Normocephalic.  Eyes:     Extraocular Movements: Extraocular movements intact.     Conjunctiva/sclera: Conjunctivae normal.     Pupils: Pupils are equal, round, and reactive to light.  Cardiovascular:     Rate and Rhythm: Normal rate and regular rhythm.     Pulses: Normal pulses.     Heart sounds: Normal heart sounds.  Pulmonary:     Effort: Pulmonary effort is normal.     Breath sounds: Normal breath sounds.  Abdominal:     General: Bowel sounds are normal.     Palpations: Abdomen is soft.  Musculoskeletal:     Cervical back: Normal range of motion.     Thoracic back: Tenderness present. No swelling. Normal range of motion.     Comments: Tenderness noted to the right thoracic spine at T4- T6.   Lymphadenopathy:     Cervical: No cervical adenopathy.  Skin:    General: Skin is warm and dry.     Findings: Abscess present.     Comments: Abscess noted to the right axilla.  Area is draining white fluid at present.  Area is also fluctuant.  Patient declined I&D, but did allow me to try to expectorate drainage.  Expectorated moderate amount of serosanguineous drainage.  Patient tolerated well.  Neurological:     General: No focal deficit present.     Mental Status:  She is alert and oriented to person, place, and time.  Psychiatric:         Mood and Affect: Mood normal.        Behavior: Behavior normal.      UC Treatments / Results  Labs (all labs ordered are listed, but only abnormal results are displayed) Labs Reviewed - No data to display  EKG   Radiology No results found.  Procedures Procedures (including critical care time)  Medications Ordered in UC Medications - No data to display  Initial Impression / Assessment and Plan / UC Course  I have reviewed the triage vital signs and the nursing notes.  Pertinent labs & imaging results that were available during my care of the patient were reviewed by me and considered in my medical decision making (see chart for details).  Patient presents for complaints of abscess and right-sided mid back pain.  Patient is well-appearing, she is in no acute distress.  On exam, patient has a right axilla abscess that was oozing and fluctuant.  Patient declined I&D at this time, but allowed expectoration of drainage from the site.  Moderate amount of drainage was returned.  Will start patient on doxycycline 100 mg twice daily for 7 days for abscess.  With regard to her back pain, she has no red flag symptoms noted.  Symptoms are consistent with myofascial strain/pain.  We will start patient on ibuprofen 800 mg and methocarbamol 500 mg twice daily.  Patient was given supportive care recommendations for the abscess to include warm compresses, and cleaning the area with an antibacterial soap twice daily.  With regard to her back, patient was advised to utilize the use of heat or ice as needed for pain, gentle stretching, with indications of when to go to the emergency department.  Patient's blood pressure was rechecked at discharge as it was grossly elevated.  Patient was advised to follow-up with her PCP within the next week for reevaluation and recheck of her blood pressure.  Patient's physical exam and vital signs are mostly stable, she is stable for discharge.  Patient was given the  opportunity to ask questions.  All questions were answered.  Follow-up as needed. Final Clinical Impressions(s) / UC Diagnoses   Final diagnoses:  Abscess of right axilla  Myofascial pain syndrome of thoracic spine     Discharge Instructions      Take medication as prescribed. Warm compresses to the affected area 3-4 times daily. Clean the area at least twice daily with Dial Gold bar soap or another type of antibacterial soap. Keep the area covered while it is draining. Follow-up in this clinic if you continue to experience pain is, increased swelling, or the area begins to look like a pimple. Go to the emergency department if you develop fever, chills, generalized fatigue, nausea, vomiting, or if the area of redness spreads into the genital region, or if you have foul-smelling drainage.   Back Pain: Take medication as prescribed. Apply ice or heat as needed.  Apply ice for pain or swelling, heat for spasm or stiffness.  Apply for 20 minutes, remove for 1 hour, then repeat is much as possible. Gentle stretching and range of motion exercises to help with pain or discomfort. Try to remain as active as possible while symptoms persist. If symptoms worsen or fail to improve, please follow-up with your primary care physician or with orthopedics. Go to the emergency department immediately if you develop numbness, tingling, worsening back pain, or other concerns. Follow-up  as needed.     ED Prescriptions     Medication Sig Dispense Auth. Provider   doxycycline (VIBRA-TABS) 100 MG tablet Take 1 tablet (100 mg total) by mouth 2 (two) times daily. 14 tablet Montay Vanvoorhis-Warren, Alda Lea, NP   ibuprofen (ADVIL) 800 MG tablet Take 1 tablet (800 mg total) by mouth every 8 (eight) hours as needed. 30 tablet Joanette Silveria-Warren, Alda Lea, NP   methocarbamol (ROBAXIN) 500 MG tablet Take 1 tablet (500 mg total) by mouth 2 (two) times daily. 20 tablet Floris Neuhaus-Warren, Alda Lea, NP      PDMP not reviewed  this encounter.   Tish Men, NP 09/15/22 1620

## 2022-09-15 NOTE — Discharge Instructions (Addendum)
Take medication as prescribed. Warm compresses to the affected area 3-4 times daily. Clean the area at least twice daily with Dial Gold bar soap or another type of antibacterial soap. Keep the area covered while it is draining. Follow-up in this clinic if you continue to experience pain is, increased swelling, or the area begins to look like a pimple. Go to the emergency department if you develop fever, chills, generalized fatigue, nausea, vomiting, or if the area of redness spreads into the genital region, or if you have foul-smelling drainage.   Back Pain: Take medication as prescribed. Apply ice or heat as needed.  Apply ice for pain or swelling, heat for spasm or stiffness.  Apply for 20 minutes, remove for 1 hour, then repeat is much as possible. Gentle stretching and range of motion exercises to help with pain or discomfort. Try to remain as active as possible while symptoms persist. If symptoms worsen or fail to improve, please follow-up with your primary care physician or with orthopedics. Go to the emergency department immediately if you develop numbness, tingling, worsening back pain, or other concerns. Follow-up as needed.

## 2022-09-15 NOTE — ED Triage Notes (Signed)
Pt reports in right axilla x 2 weeks. Reports abscess burt's still drainage.   Pt reports middle back pain x 2 weeks, worse in the past 2 days. "Feels like pulled muscle". Pain is worse when moving.

## 2022-10-18 ENCOUNTER — Encounter: Payer: Self-pay | Admitting: Family Medicine

## 2022-10-18 ENCOUNTER — Telehealth: Payer: Self-pay | Admitting: Family Medicine

## 2022-10-18 NOTE — Telephone Encounter (Signed)
Please advise. Thank you

## 2022-10-18 NOTE — Telephone Encounter (Signed)
Patient is requesting 8 hours for leave when out with her mom on FMLA . She hasn't filled out front sheet so there is nothing to go by . She gave paper work straight to provider at her mother's Elly Modena appointment on 11/13 . She called today to see if ready. Explain to her no not ready yet.Form is in your box. Momther Rosann Auerbach would like a call [phone]

## 2022-10-20 NOTE — Telephone Encounter (Signed)
Pt contacted. Form will be faxed and copy will be made. Copy will be left up front for pt to pick up.

## 2022-11-24 ENCOUNTER — Encounter (HOSPITAL_COMMUNITY): Payer: Self-pay

## 2022-11-24 ENCOUNTER — Emergency Department (HOSPITAL_COMMUNITY)
Admission: EM | Admit: 2022-11-24 | Discharge: 2022-11-24 | Disposition: A | Discharge status: Home / Self Care | Payer: 59 | Attending: Emergency Medicine | Admitting: Emergency Medicine

## 2022-11-24 ENCOUNTER — Other Ambulatory Visit: Payer: Self-pay

## 2022-11-24 DIAGNOSIS — I10 Essential (primary) hypertension: Secondary | ICD-10-CM | POA: Insufficient documentation

## 2022-11-24 DIAGNOSIS — R509 Fever, unspecified: Secondary | ICD-10-CM | POA: Diagnosis present

## 2022-11-24 DIAGNOSIS — U071 COVID-19: Secondary | ICD-10-CM | POA: Diagnosis not present

## 2022-11-24 LAB — RESP PANEL BY RT-PCR (RSV, FLU A&B, COVID)  RVPGX2
Influenza A by PCR: NEGATIVE
Influenza B by PCR: NEGATIVE
Resp Syncytial Virus by PCR: NEGATIVE
SARS Coronavirus 2 by RT PCR: POSITIVE — AB

## 2022-11-24 MED ORDER — BENZONATATE 100 MG PO CAPS: 100.0000 mg | ORAL_CAPSULE | Freq: Three times a day (TID) | ORAL | 0 refills | Status: DC

## 2022-11-24 MED ORDER — ACETAMINOPHEN 325 MG PO TABS
650.0000 mg | ORAL_TABLET | Freq: Once | ORAL | Status: AC | PRN
  Administered 2022-11-24: 650 mg via ORAL
  Filled 2022-11-24: qty 2

## 2022-11-24 MED ORDER — NAPROXEN 500 MG PO TABS: 500.0000 mg | ORAL_TABLET | Freq: Two times a day (BID) | ORAL | 0 refills | Status: DC

## 2022-11-24 NOTE — ED Triage Notes (Addendum)
Sts generalized not feeling well. Fever, body aches and headache.   Has been taking tylenol flu medicine throughout the day. Last dose 7PM.   PCP took her off one of her bp medications bc it was too low.

## 2022-11-24 NOTE — ED Provider Notes (Signed)
[facility] Emergency Department Provider Note MRN:  [id number]  Arrival date & time: 11/24/22     Chief Complaint   Fever   History of Present Illness   Kimberly Kennedy is a 36 y.o. year-old female with a history of hypertension presenting to the ED with chief complaint of fever.  Fever, cough, body aches, headache for the past day or 2.  Getting more more sick while at work today.  Review of Systems  A thorough review of systems was obtained and all systems are negative except as noted in the HPI and PMH.   Patient's Health History    Past Medical History:  Diagnosis Date   Hypertension     History reviewed. No pertinent surgical history.  Family History  Problem Relation Age of Onset   Diabetes Mother    Hypertension Mother    Hypothyroidism Mother    Breast cancer Mother    Hypertension Sister    Rheum arthritis Sister     Social History   Socioeconomic History   Marital status: Single    Spouse name: Not on file   Number of children: Not on file   Years of education: 14   Highest education level: Master's degree (e.g., MA, MS, MEng, MEd, MSW, MBA)  Occupational History   Not on file  Tobacco Use   Smoking status: Never   Smokeless tobacco: Never  Vaping Use   Vaping Use: Never used  Substance and Sexual Activity   Alcohol use: Yes    Comment: occ.   Drug use: No   Sexual activity: Yes    Birth control/protection: None  Other Topics Concern   Not on file  Social History Narrative   Not on file   Social Determinants of Health   Financial Resource Strain: Low Risk  (12/08/2020)   Overall Financial Resource Strain (CARDIA)    Difficulty of Paying Living Expenses: Not hard at all  Food Insecurity: No Food Insecurity (12/08/2020)   Hunger Vital Sign    Worried About Running Out of Food in the Last Year: Never true    Ran Out of Food in the Last Year: Never true  Transportation Needs: No Transportation Needs (12/08/2020)    PRAPARE - Hydrologist (Medical): No    Lack of Transportation (Non-Medical): No  Physical Activity: Inactive (12/08/2020)   Exercise Vital Sign    Days of Exercise per Week: 0 days    Minutes of Exercise per Session: 0 min  Stress: No Stress Concern Present (12/08/2020)   [place]    Feeling of Stress : Only a little  Social Connections: Unknown (12/08/2020)   Social Connection and Isolation Panel [NHANES]    Frequency of Communication with Friends and Family: More than three times a week    Frequency of Social Gatherings with Friends and Family: Twice a week    Attends Religious Services: More than 4 times per year    Active Member of [employer] or Organizations: Yes    Attends Archivist Meetings: More than 4 times per year    Marital Status: Not on file  Intimate Partner Violence: Not At Risk (12/08/2020)   Humiliation, Afraid, Rape, and Kick questionnaire    Fear of Current or Ex-Partner: No    Emotionally Abused: No    Physically Abused: No    Sexually Abused: No     Physical Exam   Vitals:  11/24/22 0313 11/24/22 0328  BP: (!) 207/113 (!) 184/99  Pulse:  89  Resp:  18  Temp:  98.4 F (36.9 C)  SpO2:  100%    CONSTITUTIONAL: Well-appearing, NAD NEURO/PSYCH:  Alert and oriented x 3, no focal deficits EYES:  eyes equal and reactive ENT/NECK:  no LAD, no JVD CARDIO: Regular rate, well-perfused, normal S1 and S2 PULM:  CTAB no wheezing or rhonchi GI/GU:  non-distended, non-tender MSK/SPINE:  No gross deformities, no edema SKIN:  no rash, atraumatic   *Additional and/or pertinent findings included in MDM below  Diagnostic and Interventional Summary    EKG Interpretation  Date/Time:    Ventricular Rate:    PR Interval:    QRS Duration:   QT Interval:    QTC Calculation:   R Axis:     Text Interpretation:         Labs Reviewed  RESP PANEL BY RT-PCR (RSV, FLU  A&B, COVID)  RVPGX2 - Abnormal; Notable for the following components:      Result Value   SARS Coronavirus 2 by RT PCR POSITIVE (*)    All other components within normal limits    No orders to display    Medications  acetaminophen (TYLENOL) tablet 650 mg (650 mg Oral Given 11/24/22 0308)     Procedures  /  Critical Care Procedures  ED Course and Medical Decision Making  Initial Impression and Ddx Suspicious for viral illness, no chest pain or shortness of breath, no increased work of breathing, reassuring vital signs.  Hypertensive but improving without intervention.  Past medical/surgical history that increases complexity of ED encounter: Pretension  Interpretation of Diagnostics COVID test is positive  Patient Reassessment and Ultimate Disposition/Management     Discharge.  Patient had COVID last year with similar symptoms, did not receive oral therapy and did fine.  Patient management required discussion with the following services or consulting groups:  None  Complexity of Problems Addressed Acute complicated illness or Injury  Additional Data Reviewed and Analyzed Further history obtained from: None  Additional Factors Impacting ED Encounter Risk Prescriptions  Barth Kirks. Sedonia Small, [place] [email or web address]  Final Clinical Impressions(s) / ED Diagnoses     ICD-10-CM   1. COVID-19  U07.1       ED Discharge Orders          Ordered    naproxen (NAPROSYN) 500 MG tablet  2 times daily        11/24/22 0403    benzonatate (TESSALON) 100 MG capsule  Every 8 hours        11/24/22 0403             Discharge Instructions Discussed with and Provided to Patient:    Discharge Instructions      You were evaluated in the Emergency Department and after careful evaluation, we did not find any emergent condition requiring admission or further testing in the hospital.  Your exam/testing today is overall  reassuring.  Symptoms seem to be due to COVID-19.  Use the Tessalon as needed for cough, use the Naprosyn twice daily for discomfort.  Please return to the Emergency Department if you experience any worsening of your condition.   Thank you for allowing [place] to be a part of your care.      Maudie Flakes, MD 11/24/22 [phone]

## 2022-11-24 NOTE — Discharge Instructions (Signed)
You were evaluated in the Emergency Department and after careful evaluation, we did not find any emergent condition requiring admission or further testing in the hospital.  Your exam/testing today is overall reassuring.  Symptoms seem to be due to COVID-19.  Use the Tessalon as needed for cough, use the Naprosyn twice daily for discomfort.  Please return to the Emergency Department if you experience any worsening of your condition.   Thank you for allowing [place] to be a part of your care.

## 2022-11-24 NOTE — ED Notes (Signed)
Pt is ambulatory to restroom w/out assistance.

## 2023-07-24 ENCOUNTER — Telehealth: Payer: Self-pay

## 2023-07-24 NOTE — Telephone Encounter (Signed)
Everlene Other G, DO     Needs to be seen. I have never seen this patient.

## 2023-07-24 NOTE — Telephone Encounter (Signed)
Patient dropped off document FMLA, to be filled out by provider. Patient requested to send it back via Fax within 7-days. Document is located in providers tray at front office.Please advise at Mobile [phone] (mobile)

## 2023-08-01 ENCOUNTER — Ambulatory Visit: Payer: Commercial Managed Care - PPO | Admitting: Family Medicine

## 2023-08-02 ENCOUNTER — Ambulatory Visit: Payer: 59 | Admitting: Family Medicine

## 2023-08-02 VITALS — BP 151/98 | HR 77 | Temp 98.6°F | Ht 67.0 in | Wt 346.8 lb

## 2023-08-02 DIAGNOSIS — I10 Essential (primary) hypertension: Secondary | ICD-10-CM | POA: Diagnosis not present

## 2023-08-02 DIAGNOSIS — Z029 Encounter for administrative examinations, unspecified: Secondary | ICD-10-CM

## 2023-08-06 DIAGNOSIS — Z029 Encounter for administrative examinations, unspecified: Secondary | ICD-10-CM | POA: Insufficient documentation

## 2023-08-06 NOTE — Assessment & Plan Note (Signed)
BP elevated here today. Advised to check BP at home. Continue Triamterene/hydrochlorothiazide.

## 2023-08-06 NOTE — Progress Notes (Signed)
Subjective:  Patient ID: Kimberly Kennedy, female    DOB: Apr 07, 1986  Age: 37 y.o. MRN: [id number]  CC: FMLA, establish care with me.    HPI:  37 year old female presents to establish care with me.  Needs FMLA form filled out.  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  Her mother is nonambulatory.  She requires help with activities of daily living.  Patient needs FMLA in regards to this.  Blood pressure elevated here today.  Patient states that she was previously placed on valsartan but this was discontinued due to hypotension.  She is currently on triamterene/HCTZ.  Patient Active Problem List   Diagnosis Date Noted   Administrative encounter 08/06/2023   Chronic hypertension 12/08/2020   Adnexal mass 12/08/2020   History of uterine fibroid 07/27/2020   Anemia 07/27/2020   Lower leg edema 07/27/2020   Class 3 severe obesity due to excess calories with body mass index (BMI) of 50.0 to 59.9 in adult [facility]) 07/27/2020    Social Hx   Social History   Socioeconomic History   Marital status: Single    Spouse name: Not on file   Number of children: Not on file   Years of education: 14   Highest education level: Master's degree (e.g., MA, MS, MEng, MEd, MSW, MBA)  Occupational History   Not on file  Tobacco Use   Smoking status: Never   Smokeless tobacco: Never  Vaping Use   Vaping status: Never Used  Substance and Sexual Activity   Alcohol use: Yes    Comment: occ.   Drug use: No   Sexual activity: Yes    Birth control/protection: None  Other Topics Concern   Not on file  Social History Narrative   Not on file   Social Determinants of Health   Financial Resource Strain: Low Risk  (12/08/2020)   Overall Financial Resource Strain (CARDIA)    Difficulty of Paying Living Expenses: Not hard at all  Food Insecurity: No Food Insecurity (12/08/2020)   Hunger Vital Sign    Worried About Running Out of Food in the Last Year: Never true    Ran Out of Food in the Last  Year: Never true  Transportation Needs: No Transportation Needs (12/08/2020)   PRAPARE - Administrator, Civil Service (Medical): No    Lack of Transportation (Non-Medical): No  Physical Activity: Inactive (12/08/2020)   Exercise Vital Sign    Days of Exercise per Week: 0 days    Minutes of Exercise per Session: 0 min  Stress: No Stress Concern Present (12/08/2020)   [employer] of Occupational Health - Occupational Stress Questionnaire    Feeling of Stress : Only a little  Social Connections: Unknown (12/08/2020)   Social Connection and Isolation Panel [NHANES]    Frequency of Communication with Friends and Family: More than three times a week    Frequency of Social Gatherings with Friends and Family: Twice a week    Attends Religious Services: More than 4 times per year    Active Member of [employer] or Organizations: Yes    Attends Engineer, structural: More than 4 times per year    Marital Status: Not on file    Review of Systems  Constitutional: Negative.   Respiratory: Negative.    Cardiovascular: Negative.    Objective:  BP (!) 151/98   Pulse 77   Temp 98.6 F (37 C)   Ht 5\' 7"  (1.702 m)   Wt Marland Kitchen)  346 lb 12.8 oz (157.3 kg)   SpO2 100%   BMI 54.32 kg/m      08/02/2023    4:52 PM 08/02/2023    4:15 PM 11/24/2022    3:28 AM  BP/Weight  Systolic BP 151 170 184  Diastolic BP 98 120 99  Wt. (Lbs)  346.8   BMI  54.32 kg/m2     Physical Exam Constitutional:      Appearance: Normal appearance. She is obese.  Cardiovascular:     Rate and Rhythm: Normal rate and regular rhythm.  Pulmonary:     Effort: Pulmonary effort is normal.     Breath sounds: Normal breath sounds.  Neurological:     Mental Status: She is alert.  Psychiatric:        Mood and Affect: Mood normal.        Behavior: Behavior normal.     Lab Results  Component Value Date   WBC 7.9 07/12/2022   HGB 9.2 (L) 07/12/2022   HCT 30.6 (L) 07/12/2022   PLT 289 07/12/2022   GLUCOSE  85 07/12/2022   CHOL 138 07/12/2022   TRIG 93 07/12/2022   HDL 45 07/12/2022   LDLCALC 75 07/12/2022   ALT 12 07/12/2022   AST 12 07/12/2022   NA 139 07/12/2022   K 4.7 07/12/2022   CL 100 07/12/2022   CREATININE 0.84 07/12/2022   BUN 12 07/12/2022   CO2 21 07/12/2022   TSH 1.640 06/26/2017   HGBA1C 6.2 (H) 07/12/2022     Assessment & Plan:   Problem List Items Addressed This Visit       Cardiovascular and Mediastinum   Chronic hypertension    BP elevated here today. Advised to check BP at home. Continue Triamterene/hydrochlorothiazide.        Other   Administrative encounter - Primary    FMLA formed filled out.        Everlene Other DO [facility]

## 2023-08-06 NOTE — Assessment & Plan Note (Signed)
FMLA formed filled out.

## 2024-07-09 ENCOUNTER — Telehealth: Payer: Self-pay

## 2024-07-09 NOTE — Telephone Encounter (Signed)
 Patient dropped off document FMLA, to be filled out by provider. Patient requested to send it back via Call Patient to pick up within ASAP. Document is located in providers tray at front office.Please advise at Mobile [phone] (mobile)

## 2024-07-16 ENCOUNTER — Encounter: Payer: Self-pay | Admitting: Family Medicine

## 2024-07-17 ENCOUNTER — Encounter: Payer: Self-pay | Admitting: Physician Assistant

## 2024-07-17 ENCOUNTER — Other Ambulatory Visit (HOSPITAL_COMMUNITY): Payer: Self-pay

## 2024-07-17 ENCOUNTER — Ambulatory Visit (INDEPENDENT_AMBULATORY_CARE_PROVIDER_SITE_OTHER): Admitting: Physician Assistant

## 2024-07-17 VITALS — BP 142/92 | HR 69 | Temp 98.6°F | Ht 67.0 in | Wt 335.6 lb

## 2024-07-17 DIAGNOSIS — I10 Essential (primary) hypertension: Secondary | ICD-10-CM | POA: Diagnosis not present

## 2024-07-17 DIAGNOSIS — Z0000 Encounter for general adult medical examination without abnormal findings: Secondary | ICD-10-CM | POA: Diagnosis not present

## 2024-07-17 DIAGNOSIS — Z13220 Encounter for screening for lipoid disorders: Secondary | ICD-10-CM

## 2024-07-17 DIAGNOSIS — R600 Localized edema: Secondary | ICD-10-CM

## 2024-07-17 MED ORDER — TRIAMTERENE-HCTZ 37.5-25 MG PO CAPS
1.0000 | ORAL_CAPSULE | Freq: Every day | ORAL | 1 refills | Status: AC
  Filled 2024-07-17 – 2024-07-23 (×2): qty 90, 90d supply, fill #0
  Filled 2024-12-08: qty 90, 90d supply, fill #1

## 2024-07-17 NOTE — Assessment & Plan Note (Signed)
 Chronic leg swelling, worsens with prolonged sitting, improves with elevation. No new symptoms or skin changes. - Consider compression stockings if tolerated. - Continue triamterene -hydrochlorothiazide , medication refilled today.

## 2024-07-17 NOTE — Assessment & Plan Note (Signed)
 Hypertension managed with triamterene  and diuretic. Working on medication adherence. Occasional headaches not related to hypertension. Blood pressure elevated in office today.  - Continue current antihypertensive regimen. - Encourage consistent medication adherence.

## 2024-07-17 NOTE — Progress Notes (Signed)
 Complete physical exam  Patient: Kimberly Kennedy   DOB: 18-Sep-1986   38 y.o. Female  MRN: [id number]  Subjective:    No chief complaint on file.  History of Present Illness Kimberly Kennedy is a 38 y.o. female who presents today for a complete physical exam. Discussed the use of AI scribe software for clinical note transcription with the patient, who gave verbal consent to proceed.  She a a past medical history  significant for hypertension who presents with concerns of leg swelling and tingling in her hands during physical activity.  She experiences persistent leg swelling, especially after prolonged sitting at her job. The swelling decreases with elevation but occasionally remains present upon waking. Compression stockings are uncomfortable for her. She is on a combination pill of triamterene  and hydrochlorothiazide , which she is working on taking more consistently. There are no skin color changes, redness, or localized pain in her lower legs.  During extended physical activity, such as walking a mile, she experiences swelling and tingling in her hands, with tingling extending slightly into her arms. These symptoms occur primarily during prolonged physical exertion.  She experiences general headaches, mostly during her menstrual cycle or when she does not eat, but these are not a major concern for her.  She is improving her diet by incorporating more vegetables and protein, typically eating breakfast and dinner, and has reduced soda intake, opting for juice and occasionally ginger ale. She is trying to drink more water. She has started walking for exercise but was deterred by the tingling in her hands. She plans to increase her activity once the weather improves.  Her sleep is limited to about four to five hours per day due to her work schedule and caregiving responsibilities for her mother. She acknowledges snoring, which her mother has observed, but it does not seem to cause significant  issues at this time.   She does not have additional problems to discuss today.    Most recent fall risk assessment:    08/02/2023    4:25 PM  Fall Risk   Falls in the past year? 0     Most recent depression screenings:    08/02/2023    4:26 PM 07/12/2022    2:18 PM  PHQ 2/9 Scores  PHQ - 2 Score 0 0  PHQ- 9 Score 0     Vision:Within last year and Dental: No current dental problems and No regular dental care   Patient Care Team: Cook, Jayce G, DO as PCP - General (Family Medicine)   Outpatient Medications Prior to Visit  Medication Sig   Iron , Ferrous Sulfate , 325 (65 Fe) MG TABS Take 325 mg by mouth daily.   Polyethylene Glycol 400 0.25 % SOLN Apply 1 drop to eye daily as needed. For dry eye relief   [DISCONTINUED] ibuprofen  (ADVIL ) 800 MG tablet Take 1 tablet (800 mg total) by mouth every 8 (eight) hours as needed.   [DISCONTINUED] triamterene -hydrochlorothiazide  (DYAZIDE ) 37.5-25 MG capsule Take 1 capsule by mouth once daily.   No facility-administered medications prior to visit.    Review of Systems  Constitutional:  Negative for chills, fever and malaise/fatigue.  Eyes:  Negative for blurred vision and double vision.  Respiratory:  Negative for cough and shortness of breath.   Cardiovascular:  Positive for leg swelling. Negative for chest pain and palpitations.  Musculoskeletal:  Negative for joint pain and myalgias.  Neurological:  Positive for headaches. Negative for dizziness.  Psychiatric/Behavioral:  Negative for depression. The  patient is not nervous/anxious.         Objective:     BP (!) 142/92   Pulse 69   Temp 98.6 F (37 C)   Ht 5' 7 (1.702 m)   Wt (!) 335 lb 9.6 oz (152.2 kg)   SpO2 100%   BMI 52.56 kg/m   Physical Exam Constitutional:      Appearance: Normal appearance. She is obese.  HENT:     Head: Normocephalic and atraumatic.     Right Ear: Tympanic membrane normal.     Left Ear: Tympanic membrane normal.     Nose: Nose normal.      Mouth/Throat:     Mouth: Mucous membranes are moist.     Pharynx: Oropharynx is clear.  Eyes:     Extraocular Movements: Extraocular movements intact.     Conjunctiva/sclera: Conjunctivae normal.  Neck:     Thyroid: No thyroid mass, thyromegaly or thyroid tenderness.  Cardiovascular:     Rate and Rhythm: Normal rate and regular rhythm.     Heart sounds: Normal heart sounds. No murmur heard. Pulmonary:     Effort: Pulmonary effort is normal.     Breath sounds: Normal breath sounds. No wheezing or rales.  Abdominal:     General: Abdomen is flat. Bowel sounds are normal.     Palpations: Abdomen is soft.     Tenderness: There is no abdominal tenderness.  Musculoskeletal:     Cervical back: Normal range of motion and neck supple.  Lymphadenopathy:     Cervical: No cervical adenopathy.  Skin:    General: Skin is warm and dry.  Neurological:     General: No focal deficit present.     Mental Status: She is alert and oriented to person, place, and time.  Psychiatric:        Mood and Affect: Mood normal.        Behavior: Behavior normal.      No results found for any visits on 07/17/24.    Assessment & Plan:    Routine Health Maintenance and Physical Exam  Health Maintenance  Topic Date Due   HIV Screening  Never done   Hepatitis C Screening  Never done   DTaP/Tdap/Td vaccine (1 - Tdap) Never done   Hepatitis B Vaccine (1 of 3 - 19+ 3-dose series) Never done   HPV Vaccine (1 - 3-dose SCDM series) Never done   COVID-19 Vaccine (3 - 2024-25 season) 08/05/2023   Flu Shot  07/04/2024   Pap with HPV screening  07/14/2025   Meningitis B Vaccine  Aged Out    Discussed health benefits of physical activity, and encouraged her to engage in regular exercise appropriate for her age and condition.  Problem List Items Addressed This Visit     Lower leg edema   Chronic leg swelling, worsens with prolonged sitting, improves with elevation. No new symptoms or skin changes. -  Consider compression stockings if tolerated. - Continue triamterene -hydrochlorothiazide , medication refilled today.       Relevant Medications   triamterene -hydrochlorothiazide  (DYAZIDE ) 37.5-25 MG capsule   Chronic hypertension   Hypertension managed with triamterene  and diuretic. Working on medication adherence. Occasional headaches not related to hypertension. Blood pressure elevated in office today.  - Continue current antihypertensive regimen. - Encourage consistent medication adherence.      Relevant Medications   triamterene -hydrochlorothiazide  (DYAZIDE ) 37.5-25 MG capsule   Other Relevant Orders   Lipid panel   CMP14+EGFR   CBC with Differential/Platelet  Other Visit Diagnoses       Annual visit for general adult medical examination without abnormal findings    -  Primary     Screening for lipid disorders       Relevant Orders   Lipid panel      Return in about 6 months (around 01/17/2025) for BP.  Safety measures discussed. Immunizations reviewed: coming due for influenza vaccination Diet and exercise/ lifestyle modifications discussed:  Recommend 150 minutes per week of exercise such as walking. Recommend lots of fresh produce to include fruits, vegetables, beans, healthy fats such as avocado, nuts, seeds, and 3-6 ounces of protein at each meal.  Avoid fried foods and fast food. Limit alcohol consumption: no more than one drink per day for women and 2 drinks per day for men.  Stress management discussed. Discussed importance of sleep. Chronic sleep deprivation due to work and caregiving, averaging 4-5 hours of sleep per night. No significant daytime dysfunction. Encourage aiming for 6-8 hours of sleep per night. Routine vision and dental screening discussed: recommend dentist every 6 months, gets vision checked every 1-2 years.  Health maintenance: up to date, Pap due in 2028 Questions answered.        Charmaine Caralyn Twining, PA-C

## 2024-07-18 LAB — CBC WITH DIFFERENTIAL/PLATELET
Basophils Absolute: 0 x10E3/uL (ref 0.0–0.2)
Basos: 1 %
EOS (ABSOLUTE): 0.2 x10E3/uL (ref 0.0–0.4)
Eos: 3 %
Hematocrit: 32.1 % — ABNORMAL LOW (ref 34.0–46.6)
Hemoglobin: 8.9 g/dL — ABNORMAL LOW (ref 11.1–15.9)
Immature Grans (Abs): 0 x10E3/uL (ref 0.0–0.1)
Immature Granulocytes: 0 %
Lymphocytes Absolute: 2 x10E3/uL (ref 0.7–3.1)
Lymphs: 31 %
MCH: 19.9 pg — ABNORMAL LOW (ref 26.6–33.0)
MCHC: 27.7 g/dL — ABNORMAL LOW (ref 31.5–35.7)
MCV: 72 fL — ABNORMAL LOW (ref 79–97)
Monocytes Absolute: 0.4 x10E3/uL (ref 0.1–0.9)
Monocytes: 7 %
Neutrophils Absolute: 3.9 x10E3/uL (ref 1.4–7.0)
Neutrophils: 58 %
Platelets: 318 x10E3/uL (ref 150–450)
RBC: 4.47 x10E6/uL (ref 3.77–5.28)
RDW: 17.7 % — ABNORMAL HIGH (ref 11.7–15.4)
WBC: 6.6 x10E3/uL (ref 3.4–10.8)

## 2024-07-18 LAB — CMP14+EGFR
ALT: 11 IU/L (ref 0–32)
AST: 13 IU/L (ref 0–40)
Albumin: 4.5 g/dL (ref 3.9–4.9)
Alkaline Phosphatase: 58 IU/L (ref 44–121)
BUN/Creatinine Ratio: 14 (ref 9–23)
BUN: 12 mg/dL (ref 6–24)
Bilirubin Total: 0.3 mg/dL (ref 0.0–1.2)
CO2: 20 mmol/L (ref 20–29)
Calcium: 9.2 mg/dL (ref 8.7–10.2)
Chloride: 102 mmol/L (ref 96–106)
Creatinine, Ser: 0.86 mg/dL (ref 0.57–1.00)
Globulin, Total: 3.3 g/dL (ref 1.5–4.5)
Glucose: 103 mg/dL — ABNORMAL HIGH (ref 70–99)
Potassium: 4.3 mmol/L (ref 3.5–5.2)
Sodium: 137 mmol/L (ref 134–144)
Total Protein: 7.8 g/dL (ref 6.0–8.5)
eGFR: 88 mL/min/1.73 (ref >59)

## 2024-07-18 LAB — LIPID PANEL
Chol/HDL Ratio: 2.8 ratio (ref 0.0–4.4)
Cholesterol, Total: 133 mg/dL (ref 100–199)
HDL: 48 mg/dL (ref >39)
LDL Chol Calc (NIH): 72 mg/dL (ref 0–99)
Triglycerides: 64 mg/dL (ref 0–149)
VLDL Cholesterol Cal: 13 mg/dL (ref 5–40)

## 2024-07-23 ENCOUNTER — Other Ambulatory Visit (HOSPITAL_COMMUNITY): Payer: Self-pay

## 2024-07-24 ENCOUNTER — Other Ambulatory Visit (HOSPITAL_COMMUNITY): Payer: Self-pay

## 2024-07-24 ENCOUNTER — Other Ambulatory Visit: Payer: Self-pay

## 2024-07-27 ENCOUNTER — Ambulatory Visit: Payer: Self-pay | Admitting: Physician Assistant

## 2024-07-28 ENCOUNTER — Other Ambulatory Visit (HOSPITAL_COMMUNITY): Payer: Self-pay

## 2024-12-08 ENCOUNTER — Encounter: Payer: Self-pay | Admitting: Family Medicine

## 2025-01-15 ENCOUNTER — Ambulatory Visit: Admitting: Family Medicine

## 2025-01-16 ENCOUNTER — Ambulatory Visit: Admitting: Family Medicine
